# Patient Record
Sex: Male | Born: 1955 | Race: White | Hispanic: No | Marital: Married | Smoking: Never smoker
Health system: Southern US, Community
[De-identification: ages and names within clinical notes are randomized; demographics above are authoritative.]

## PROBLEM LIST (undated history)

## (undated) DIAGNOSIS — S83512A Sprain of anterior cruciate ligament of left knee, initial encounter: Secondary | ICD-10-CM

## (undated) DIAGNOSIS — K219 Gastro-esophageal reflux disease without esophagitis: Secondary | ICD-10-CM

## (undated) DIAGNOSIS — I10 Essential (primary) hypertension: Secondary | ICD-10-CM

## (undated) DIAGNOSIS — E785 Hyperlipidemia, unspecified: Secondary | ICD-10-CM

## (undated) DIAGNOSIS — Z8719 Personal history of other diseases of the digestive system: Secondary | ICD-10-CM

## (undated) HISTORY — PX: NASAL SINUS SURGERY: SHX719

## (undated) HISTORY — PX: HAIR TRANSPLANT: SHX1719

---

## 2015-06-14 ENCOUNTER — Other Ambulatory Visit: Payer: Self-pay | Admitting: Physician Assistant

## 2015-06-20 ENCOUNTER — Encounter (HOSPITAL_BASED_OUTPATIENT_CLINIC_OR_DEPARTMENT_OTHER): Payer: Self-pay | Admitting: *Deleted

## 2015-06-26 ENCOUNTER — Ambulatory Visit (HOSPITAL_BASED_OUTPATIENT_CLINIC_OR_DEPARTMENT_OTHER)
Admission: RE | Admit: 2015-06-26 | Payer: BLUE CROSS/BLUE SHIELD | Source: Ambulatory Visit | Admitting: Orthopedic Surgery

## 2015-06-26 HISTORY — DX: Gastro-esophageal reflux disease without esophagitis: K21.9

## 2015-06-26 HISTORY — DX: Hyperlipidemia, unspecified: E78.5

## 2015-06-26 HISTORY — DX: Sprain of anterior cruciate ligament of left knee, initial encounter: S83.512A

## 2015-06-26 HISTORY — DX: Personal history of other diseases of the digestive system: Z87.19

## 2015-06-26 HISTORY — DX: Essential (primary) hypertension: I10

## 2015-06-26 SURGERY — KNEE ARTHROSCOPY WITH ANTERIOR CRUCIATE LIGAMENT (ACL) REPAIR WITH HAMSTRING GRAFT
Anesthesia: General | Site: Knee | Laterality: Left

## 2015-11-14 DIAGNOSIS — R5383 Other fatigue: Secondary | ICD-10-CM | POA: Insufficient documentation

## 2015-11-14 DIAGNOSIS — G4710 Hypersomnia, unspecified: Secondary | ICD-10-CM | POA: Insufficient documentation

## 2016-03-11 DIAGNOSIS — G4733 Obstructive sleep apnea (adult) (pediatric): Secondary | ICD-10-CM | POA: Diagnosis not present

## 2016-03-21 DIAGNOSIS — G4733 Obstructive sleep apnea (adult) (pediatric): Secondary | ICD-10-CM | POA: Diagnosis not present

## 2016-04-11 DIAGNOSIS — G4733 Obstructive sleep apnea (adult) (pediatric): Secondary | ICD-10-CM | POA: Diagnosis not present

## 2016-05-09 DIAGNOSIS — Z0000 Encounter for general adult medical examination without abnormal findings: Secondary | ICD-10-CM | POA: Diagnosis not present

## 2016-05-09 DIAGNOSIS — I10 Essential (primary) hypertension: Secondary | ICD-10-CM | POA: Diagnosis not present

## 2016-05-09 DIAGNOSIS — E7800 Pure hypercholesterolemia, unspecified: Secondary | ICD-10-CM | POA: Diagnosis not present

## 2016-05-10 DIAGNOSIS — Z1211 Encounter for screening for malignant neoplasm of colon: Secondary | ICD-10-CM | POA: Diagnosis not present

## 2016-05-10 DIAGNOSIS — K219 Gastro-esophageal reflux disease without esophagitis: Secondary | ICD-10-CM | POA: Diagnosis not present

## 2016-05-10 DIAGNOSIS — Z79899 Other long term (current) drug therapy: Secondary | ICD-10-CM | POA: Diagnosis not present

## 2016-05-11 DIAGNOSIS — G4733 Obstructive sleep apnea (adult) (pediatric): Secondary | ICD-10-CM | POA: Diagnosis not present

## 2016-05-14 DIAGNOSIS — E7800 Pure hypercholesterolemia, unspecified: Secondary | ICD-10-CM | POA: Diagnosis not present

## 2016-05-14 DIAGNOSIS — I10 Essential (primary) hypertension: Secondary | ICD-10-CM | POA: Diagnosis not present

## 2016-05-16 DIAGNOSIS — H5213 Myopia, bilateral: Secondary | ICD-10-CM | POA: Diagnosis not present

## 2016-05-16 DIAGNOSIS — H33301 Unspecified retinal break, right eye: Secondary | ICD-10-CM | POA: Diagnosis not present

## 2016-05-16 DIAGNOSIS — H2513 Age-related nuclear cataract, bilateral: Secondary | ICD-10-CM | POA: Diagnosis not present

## 2016-05-20 ENCOUNTER — Inpatient Hospital Stay (HOSPITAL_COMMUNITY)
Admission: EM | Admit: 2016-05-20 | Discharge: 2016-05-22 | DRG: 247 | Disposition: A | Discharge status: Home / Self Care | Payer: BLUE CROSS/BLUE SHIELD | Attending: Cardiology | Admitting: Cardiology

## 2016-05-20 ENCOUNTER — Emergency Department (HOSPITAL_COMMUNITY): Payer: BLUE CROSS/BLUE SHIELD

## 2016-05-20 ENCOUNTER — Encounter (HOSPITAL_COMMUNITY): Payer: Self-pay | Admitting: Emergency Medicine

## 2016-05-20 ENCOUNTER — Encounter (HOSPITAL_COMMUNITY)
Admission: EM | Disposition: A | Discharge status: Home / Self Care | Payer: Self-pay | Source: Home / Self Care | Attending: Cardiology

## 2016-05-20 DIAGNOSIS — E785 Hyperlipidemia, unspecified: Secondary | ICD-10-CM | POA: Diagnosis not present

## 2016-05-20 DIAGNOSIS — K219 Gastro-esophageal reflux disease without esophagitis: Secondary | ICD-10-CM | POA: Diagnosis present

## 2016-05-20 DIAGNOSIS — G4733 Obstructive sleep apnea (adult) (pediatric): Secondary | ICD-10-CM | POA: Diagnosis not present

## 2016-05-20 DIAGNOSIS — I9589 Other hypotension: Secondary | ICD-10-CM | POA: Diagnosis not present

## 2016-05-20 DIAGNOSIS — Q231 Congenital insufficiency of aortic valve: Secondary | ICD-10-CM

## 2016-05-20 DIAGNOSIS — I77810 Thoracic aortic ectasia: Secondary | ICD-10-CM | POA: Diagnosis not present

## 2016-05-20 DIAGNOSIS — K449 Diaphragmatic hernia without obstruction or gangrene: Secondary | ICD-10-CM | POA: Diagnosis present

## 2016-05-20 DIAGNOSIS — Z955 Presence of coronary angioplasty implant and graft: Secondary | ICD-10-CM

## 2016-05-20 DIAGNOSIS — I2129 ST elevation (STEMI) myocardial infarction involving other sites: Secondary | ICD-10-CM | POA: Diagnosis not present

## 2016-05-20 DIAGNOSIS — R739 Hyperglycemia, unspecified: Secondary | ICD-10-CM | POA: Diagnosis not present

## 2016-05-20 DIAGNOSIS — R079 Chest pain, unspecified: Secondary | ICD-10-CM | POA: Diagnosis not present

## 2016-05-20 DIAGNOSIS — Z79899 Other long term (current) drug therapy: Secondary | ICD-10-CM

## 2016-05-20 DIAGNOSIS — I10 Essential (primary) hypertension: Secondary | ICD-10-CM | POA: Diagnosis present

## 2016-05-20 DIAGNOSIS — I2111 ST elevation (STEMI) myocardial infarction involving right coronary artery: Secondary | ICD-10-CM | POA: Diagnosis not present

## 2016-05-20 DIAGNOSIS — I213 ST elevation (STEMI) myocardial infarction of unspecified site: Secondary | ICD-10-CM | POA: Diagnosis present

## 2016-05-20 DIAGNOSIS — R0602 Shortness of breath: Secondary | ICD-10-CM | POA: Diagnosis not present

## 2016-05-20 DIAGNOSIS — I2119 ST elevation (STEMI) myocardial infarction involving other coronary artery of inferior wall: Secondary | ICD-10-CM | POA: Diagnosis present

## 2016-05-20 HISTORY — PX: CARDIAC CATHETERIZATION: SHX172

## 2016-05-20 LAB — I-STAT CHEM 8, ED
BUN: 23 mg/dL — ABNORMAL HIGH (ref 6–20)
Calcium, Ion: 1.15 mmol/L (ref 1.12–1.23)
Chloride: 98 mmol/L — ABNORMAL LOW (ref 101–111)
Creatinine, Ser: 1.3 mg/dL — ABNORMAL HIGH (ref 0.61–1.24)
GLUCOSE: 117 mg/dL — AB (ref 65–99)
HCT: 49 % (ref 39.0–52.0)
HEMOGLOBIN: 16.7 g/dL (ref 13.0–17.0)
POTASSIUM: 3.3 mmol/L — AB (ref 3.5–5.1)
Sodium: 139 mmol/L (ref 135–145)
TCO2: 27 mmol/L (ref 0–100)

## 2016-05-20 LAB — APTT: aPTT: 23 seconds — ABNORMAL LOW (ref 24–37)

## 2016-05-20 LAB — DIFFERENTIAL
Basophils Absolute: 0.1 10*3/uL (ref 0.0–0.1)
Basophils Relative: 1 %
Eosinophils Absolute: 0.1 10*3/uL (ref 0.0–0.7)
Eosinophils Relative: 1 %
LYMPHS ABS: 2.2 10*3/uL (ref 0.7–4.0)
LYMPHS PCT: 23 %
MONO ABS: 0.7 10*3/uL (ref 0.1–1.0)
MONOS PCT: 8 %
NEUTROS ABS: 6.6 10*3/uL (ref 1.7–7.7)
Neutrophils Relative %: 67 %

## 2016-05-20 LAB — CBC
HEMATOCRIT: 45.8 % (ref 39.0–52.0)
Hemoglobin: 16 g/dL (ref 13.0–17.0)
MCH: 30.8 pg (ref 26.0–34.0)
MCHC: 34.9 g/dL (ref 30.0–36.0)
MCV: 88.1 fL (ref 78.0–100.0)
Platelets: 206 10*3/uL (ref 150–400)
RBC: 5.2 MIL/uL (ref 4.22–5.81)
RDW: 12.6 % (ref 11.5–15.5)
WBC: 9.7 10*3/uL (ref 4.0–10.5)

## 2016-05-20 LAB — COMPREHENSIVE METABOLIC PANEL
ALBUMIN: 4.4 g/dL (ref 3.5–5.0)
ALK PHOS: 53 U/L (ref 38–126)
ALT: 34 U/L (ref 17–63)
ANION GAP: 13 (ref 5–15)
AST: 34 U/L (ref 15–41)
BUN: 20 mg/dL (ref 6–20)
CALCIUM: 9.9 mg/dL (ref 8.9–10.3)
CO2: 26 mmol/L (ref 22–32)
CREATININE: 1.37 mg/dL — AB (ref 0.61–1.24)
Chloride: 99 mmol/L — ABNORMAL LOW (ref 101–111)
GFR calc Af Amer: >60 mL/min (ref >60)
GFR calc non Af Amer: 56 mL/min — ABNORMAL LOW (ref >60)
GLUCOSE: 119 mg/dL — AB (ref 65–99)
Potassium: 3.3 mmol/L — ABNORMAL LOW (ref 3.5–5.1)
SODIUM: 138 mmol/L (ref 135–145)
Total Bilirubin: 0.8 mg/dL (ref 0.3–1.2)
Total Protein: 7.6 g/dL (ref 6.5–8.1)

## 2016-05-20 LAB — TROPONIN I: Troponin I: 0.19 ng/mL — ABNORMAL HIGH (ref <0.031)

## 2016-05-20 LAB — LIPID PANEL
CHOL/HDL RATIO: 3.3 ratio
Cholesterol: 199 mg/dL (ref 0–200)
HDL: 61 mg/dL (ref >40)
LDL CALC: 124 mg/dL — AB (ref 0–99)
TRIGLYCERIDES: 72 mg/dL (ref <150)
VLDL: 14 mg/dL (ref 0–40)

## 2016-05-20 LAB — PROTIME-INR
INR: 1.09 (ref 0.00–1.49)
Prothrombin Time: 14.3 seconds (ref 11.6–15.2)

## 2016-05-20 SURGERY — LEFT HEART CATH AND CORONARY ANGIOGRAPHY
Anesthesia: LOCAL

## 2016-05-20 MED ORDER — PANTOPRAZOLE SODIUM 40 MG PO TBEC
40.0000 mg | DELAYED_RELEASE_TABLET | Freq: Every day | ORAL | Status: DC
  Administered 2016-05-21 – 2016-05-22 (×2): 40 mg via ORAL
  Filled 2016-05-20 (×2): qty 1

## 2016-05-20 MED ORDER — NITROGLYCERIN 1 MG/10 ML FOR IR/CATH LAB
INTRA_ARTERIAL | Status: AC
  Filled 2016-05-20: qty 10

## 2016-05-20 MED ORDER — MIDAZOLAM HCL 2 MG/2ML IJ SOLN
INTRAMUSCULAR | Status: AC
  Filled 2016-05-20: qty 2

## 2016-05-20 MED ORDER — BIVALIRUDIN 250 MG IV SOLR
INTRAVENOUS | Status: AC
  Filled 2016-05-20: qty 250

## 2016-05-20 MED ORDER — OMEGA-3-ACID ETHYL ESTERS 1 G PO CAPS
2.0000 g | ORAL_CAPSULE | Freq: Two times a day (BID) | ORAL | Status: DC
  Administered 2016-05-21 – 2016-05-22 (×3): 2 g via ORAL
  Filled 2016-05-20 (×4): qty 2

## 2016-05-20 MED ORDER — FAMOTIDINE 20 MG PO TABS
20.0000 mg | ORAL_TABLET | Freq: Two times a day (BID) | ORAL | Status: DC
Start: 2016-05-20 — End: 2016-05-22
  Administered 2016-05-21 – 2016-05-22 (×4): 20 mg via ORAL
  Filled 2016-05-20 (×4): qty 1

## 2016-05-20 MED ORDER — HYDROMORPHONE HCL 1 MG/ML IJ SOLN
INTRAMUSCULAR | Status: AC
  Filled 2016-05-20: qty 1

## 2016-05-20 MED ORDER — HEPARIN SODIUM (PORCINE) 1000 UNIT/ML IJ SOLN
INTRAMUSCULAR | Status: DC | PRN
  Administered 2016-05-20: 3000 [IU] via INTRAVENOUS
  Administered 2016-05-20: 10000 [IU] via INTRAVENOUS
  Administered 2016-05-20: 2000 [IU] via INTRAVENOUS

## 2016-05-20 MED ORDER — TICAGRELOR 90 MG PO TABS
ORAL_TABLET | ORAL | Status: DC | PRN
  Administered 2016-05-20: 180 mg via ORAL

## 2016-05-20 MED ORDER — BIVALIRUDIN BOLUS VIA INFUSION - CUPID
INTRAVENOUS | Status: DC | PRN
  Administered 2016-05-20: 62.925 mg via INTRAVENOUS

## 2016-05-20 MED ORDER — SODIUM CHLORIDE 0.9 % IV SOLN
50000.0000 ug | INTRAVENOUS | Status: DC | PRN
  Administered 2016-05-20: 4 ug/kg/min via INTRAVENOUS

## 2016-05-20 MED ORDER — ADULT MULTIVITAMIN W/MINERALS CH
1.0000 | ORAL_TABLET | Freq: Every day | ORAL | Status: DC
  Administered 2016-05-21 – 2016-05-22 (×2): 1 via ORAL
  Filled 2016-05-20 (×2): qty 1

## 2016-05-20 MED ORDER — HEPARIN (PORCINE) IN NACL 2-0.9 UNIT/ML-% IJ SOLN
INTRAMUSCULAR | Status: AC
  Filled 2016-05-20: qty 1000

## 2016-05-20 MED ORDER — HYDROCHLOROTHIAZIDE 25 MG PO TABS
25.0000 mg | ORAL_TABLET | Freq: Every day | ORAL | Status: DC
  Administered 2016-05-21: 25 mg via ORAL
  Filled 2016-05-20: qty 1

## 2016-05-20 MED ORDER — CANGRELOR BOLUS VIA INFUSION
INTRAVENOUS | Status: DC | PRN
  Administered 2016-05-20: 2517 ug via INTRAVENOUS

## 2016-05-20 MED ORDER — ATORVASTATIN CALCIUM 80 MG PO TABS
80.0000 mg | ORAL_TABLET | Freq: Every day | ORAL | Status: DC
  Administered 2016-05-21: 80 mg via ORAL
  Filled 2016-05-20: qty 1

## 2016-05-20 MED ORDER — HEPARIN (PORCINE) IN NACL 2-0.9 UNIT/ML-% IJ SOLN
INTRAMUSCULAR | Status: DC | PRN
  Administered 2016-05-20: 23:00:00

## 2016-05-20 MED ORDER — SODIUM CHLORIDE 0.9 % IV SOLN
10.0000 mL/h | INTRAVENOUS | Status: DC
  Administered 2016-05-20: 20 mL/h via INTRAVENOUS

## 2016-05-20 MED ORDER — ASPIRIN 81 MG PO CHEW
324.0000 mg | CHEWABLE_TABLET | Freq: Once | ORAL | Status: AC
  Administered 2016-05-20: 324 mg via ORAL

## 2016-05-20 MED ORDER — MORPHINE SULFATE (PF) 2 MG/ML IV SOLN
2.0000 mg | Freq: Once | INTRAVENOUS | Status: AC
  Administered 2016-05-20: 2 mg via INTRAVENOUS
  Filled 2016-05-20: qty 1

## 2016-05-20 MED ORDER — HEPARIN SODIUM (PORCINE) 5000 UNIT/ML IJ SOLN
INTRAMUSCULAR | Status: AC
  Filled 2016-05-20: qty 1

## 2016-05-20 MED ORDER — CARVEDILOL 3.125 MG PO TABS
3.1250 mg | ORAL_TABLET | Freq: Two times a day (BID) | ORAL | Status: DC
  Administered 2016-05-21 – 2016-05-22 (×2): 3.125 mg via ORAL
  Filled 2016-05-20 (×2): qty 1

## 2016-05-20 MED ORDER — VITAMIN C 500 MG PO TABS
500.0000 mg | ORAL_TABLET | Freq: Every day | ORAL | Status: DC
  Administered 2016-05-21 – 2016-05-22 (×2): 500 mg via ORAL
  Filled 2016-05-20 (×2): qty 1

## 2016-05-20 MED ORDER — TICAGRELOR 90 MG PO TABS
90.0000 mg | ORAL_TABLET | Freq: Two times a day (BID) | ORAL | Status: DC
  Administered 2016-05-21 – 2016-05-22 (×3): 90 mg via ORAL
  Filled 2016-05-20 (×3): qty 1

## 2016-05-20 MED ORDER — IOPAMIDOL (ISOVUE-370) INJECTION 76%
INTRAVENOUS | Status: DC | PRN
  Administered 2016-05-20: 350 mL

## 2016-05-20 MED ORDER — NITROGLYCERIN 0.4 MG SL SUBL
0.4000 mg | SUBLINGUAL_TABLET | SUBLINGUAL | Status: DC | PRN
  Administered 2016-05-20 (×2): 0.4 mg via SUBLINGUAL

## 2016-05-20 MED ORDER — ASPIRIN 81 MG PO CHEW
CHEWABLE_TABLET | ORAL | Status: AC
  Administered 2016-05-20: 324 mg via ORAL
  Filled 2016-05-20: qty 4

## 2016-05-20 MED ORDER — SODIUM CHLORIDE 0.9 % IV SOLN
INTRAVENOUS | Status: AC
  Administered 2016-05-21 (×2): via INTRAVENOUS

## 2016-05-20 MED ORDER — VERAPAMIL HCL 2.5 MG/ML IV SOLN
INTRAVENOUS | Status: DC | PRN
  Administered 2016-05-20: 21:00:00 via INTRA_ARTERIAL

## 2016-05-20 MED ORDER — LOSARTAN POTASSIUM 25 MG PO TABS
25.0000 mg | ORAL_TABLET | Freq: Every day | ORAL | Status: DC
  Administered 2016-05-21 – 2016-05-22 (×2): 25 mg via ORAL
  Filled 2016-05-20 (×2): qty 1

## 2016-05-20 MED ORDER — ONDANSETRON HCL 4 MG/2ML IJ SOLN
INTRAMUSCULAR | Status: AC
  Filled 2016-05-20: qty 2

## 2016-05-20 MED ORDER — NITROGLYCERIN IN D5W 200-5 MCG/ML-% IV SOLN
0.0000 ug/min | Freq: Once | INTRAVENOUS | Status: AC
  Administered 2016-05-20: 5 ug/min via INTRAVENOUS
  Filled 2016-05-20: qty 250

## 2016-05-20 MED ORDER — TICAGRELOR 90 MG PO TABS
ORAL_TABLET | ORAL | Status: AC
  Filled 2016-05-20: qty 1

## 2016-05-20 MED ORDER — DIAZEPAM 5 MG PO TABS
5.0000 mg | ORAL_TABLET | Freq: Four times a day (QID) | ORAL | Status: DC | PRN
  Administered 2016-05-21: 5 mg via ORAL
  Filled 2016-05-20: qty 1

## 2016-05-20 MED ORDER — NITROGLYCERIN 0.4 MG SL SUBL
SUBLINGUAL_TABLET | SUBLINGUAL | Status: AC
  Administered 2016-05-20: 0.4 mg via SUBLINGUAL
  Filled 2016-05-20: qty 3

## 2016-05-20 MED ORDER — LIDOCAINE HCL (PF) 1 % IJ SOLN
INTRAMUSCULAR | Status: DC | PRN
  Administered 2016-05-20: 15 mL
  Administered 2016-05-20: 2 mL

## 2016-05-20 MED ORDER — FENTANYL CITRATE (PF) 100 MCG/2ML IJ SOLN
INTRAMUSCULAR | Status: AC
  Filled 2016-05-20: qty 2

## 2016-05-20 MED ORDER — SODIUM CHLORIDE 0.9% FLUSH: 3.0000 mL | INTRAVENOUS | Status: DC | PRN

## 2016-05-20 MED ORDER — ASPIRIN 81 MG PO CHEW
81.0000 mg | CHEWABLE_TABLET | Freq: Every day | ORAL | Status: DC
  Administered 2016-05-21 – 2016-05-22 (×2): 81 mg via ORAL
  Filled 2016-05-20 (×2): qty 1

## 2016-05-20 MED ORDER — HEPARIN SODIUM (PORCINE) 1000 UNIT/ML IJ SOLN
INTRAMUSCULAR | Status: AC
  Filled 2016-05-20: qty 1

## 2016-05-20 MED ORDER — ACETAMINOPHEN 325 MG PO TABS: 650.0000 mg | ORAL_TABLET | ORAL | Status: DC | PRN

## 2016-05-20 MED ORDER — GLUCOSAMINE-CHONDROITIN 500-400 MG PO TABS: 1.0000 | ORAL_TABLET | Freq: Three times a day (TID) | ORAL | Status: DC

## 2016-05-20 MED ORDER — MIDAZOLAM HCL 2 MG/2ML IJ SOLN
INTRAMUSCULAR | Status: DC | PRN
  Administered 2016-05-20: 1 mg via INTRAVENOUS
  Administered 2016-05-20 (×3): 2 mg via INTRAVENOUS

## 2016-05-20 MED ORDER — FENTANYL CITRATE (PF) 100 MCG/2ML IJ SOLN
INTRAMUSCULAR | Status: DC | PRN
  Administered 2016-05-20 (×2): 50 ug via INTRAVENOUS

## 2016-05-20 MED ORDER — MORPHINE SULFATE (PF) 2 MG/ML IV SOLN
INTRAVENOUS | Status: AC
  Administered 2016-05-20: 2 mg via INTRAVENOUS
  Filled 2016-05-20: qty 1

## 2016-05-20 MED ORDER — HEPARIN (PORCINE) IN NACL 100-0.45 UNIT/ML-% IJ SOLN
1100.0000 [IU]/h | INTRAMUSCULAR | Status: DC
  Administered 2016-05-20: 1100 [IU]/h via INTRAVENOUS
  Filled 2016-05-20: qty 250

## 2016-05-20 MED ORDER — LIDOCAINE HCL (PF) 1 % IJ SOLN
INTRAMUSCULAR | Status: AC
  Filled 2016-05-20: qty 30

## 2016-05-20 MED ORDER — SODIUM CHLORIDE 0.9 % IV SOLN
250.0000 mg | INTRAVENOUS | Status: DC | PRN
  Administered 2016-05-20: 1.75 mg/kg/h via INTRAVENOUS

## 2016-05-20 MED ORDER — ATROPINE SULFATE 1 MG/10ML IJ SOSY
PREFILLED_SYRINGE | INTRAMUSCULAR | Status: DC | PRN
  Administered 2016-05-20: 1 mg via INTRAVENOUS

## 2016-05-20 MED ORDER — ALPRAZOLAM 0.25 MG PO TABS: 0.2500 mg | ORAL_TABLET | Freq: Two times a day (BID) | ORAL | Status: DC | PRN

## 2016-05-20 MED ORDER — VERAPAMIL HCL 2.5 MG/ML IV SOLN
INTRAVENOUS | Status: AC
  Filled 2016-05-20: qty 2

## 2016-05-20 MED ORDER — HYDROMORPHONE HCL 1 MG/ML IJ SOLN
INTRAMUSCULAR | Status: DC | PRN
  Administered 2016-05-20 (×3): 0.5 mg via INTRAVENOUS

## 2016-05-20 MED ORDER — VITAMIN B-12 1000 MCG PO TABS
500.0000 ug | ORAL_TABLET | Freq: Every day | ORAL | Status: DC
Start: 2016-05-21 — End: 2016-05-22
  Administered 2016-05-22: 500 ug via ORAL
  Filled 2016-05-20 (×2): qty 1

## 2016-05-20 MED ORDER — HEPARIN SODIUM (PORCINE) 5000 UNIT/ML IJ SOLN
4000.0000 [IU] | INTRAMUSCULAR | Status: AC
  Administered 2016-05-20: 4000 [IU] via INTRAVENOUS

## 2016-05-20 MED ORDER — MORPHINE SULFATE (PF) 2 MG/ML IV SOLN
INTRAVENOUS | Status: AC
  Filled 2016-05-20: qty 1

## 2016-05-20 MED ORDER — SODIUM CHLORIDE 0.9% FLUSH
3.0000 mL | Freq: Two times a day (BID) | INTRAVENOUS | Status: DC
  Administered 2016-05-21 (×2): 3 mL via INTRAVENOUS

## 2016-05-20 MED ORDER — ATROPINE SULFATE 1 MG/10ML IJ SOSY
PREFILLED_SYRINGE | INTRAMUSCULAR | Status: AC
  Filled 2016-05-20: qty 10

## 2016-05-20 MED ORDER — NITROGLYCERIN 1 MG/10 ML FOR IR/CATH LAB
INTRA_ARTERIAL | Status: DC | PRN
  Administered 2016-05-20 (×2): 200 ug via INTRACORONARY

## 2016-05-20 MED ORDER — IOPAMIDOL (ISOVUE-370) INJECTION 76%
INTRAVENOUS | Status: AC
  Filled 2016-05-20: qty 100

## 2016-05-20 MED ORDER — SODIUM CHLORIDE 0.9 % IV SOLN: 250.0000 mL | INTRAVENOUS | Status: DC | PRN

## 2016-05-20 MED ORDER — CANGRELOR TETRASODIUM 50 MG IV SOLR
INTRAVENOUS | Status: AC
  Filled 2016-05-20: qty 50

## 2016-05-20 MED ORDER — ONDANSETRON HCL 4 MG/2ML IJ SOLN
4.0000 mg | Freq: Once | INTRAMUSCULAR | Status: AC
  Administered 2016-05-20: 4 mg via INTRAVENOUS
  Filled 2016-05-20: qty 2

## 2016-05-20 SURGICAL SUPPLY — 38 items
BALLN EMERGE MR 2.5X15 (BALLOONS) ×2
BALLN EUPHORA RX 2.0X10 (BALLOONS) ×2
BALLN ~~LOC~~ TREK RX 3.75X8 (BALLOONS) ×2
BALLOON EMERGE MR 2.5X15 (BALLOONS) ×1 IMPLANT
BALLOON EUPHORA RX 2.0X10 (BALLOONS) ×1 IMPLANT
BALLOON ~~LOC~~ TREK RX 3.75X8 (BALLOONS) ×1 IMPLANT
CATH EXTRAC PRONTO 5.5F 138CM (CATHETERS) ×2 IMPLANT
CATH HEARTRAIL 6F IR1.5 (CATHETERS) ×2 IMPLANT
CATH HEARTRAIL IKARI 6F IR1.0 (CATHETERS) ×2 IMPLANT
CATH INFINITI 5FR JL4 (CATHETERS) ×2 IMPLANT
CATH OPTITORQUE TIG 4.0 5F (CATHETERS) ×2 IMPLANT
CATH VISTA GUIDE 6FR JR4 (CATHETERS) ×2 IMPLANT
DEVICE CLOSURE PERCLS PRGLD 6F (VASCULAR PRODUCTS) ×1 IMPLANT
DEVICE RAD COMP TR BAND LRG (VASCULAR PRODUCTS) ×2 IMPLANT
DEVICE TORQUE .014-.018 (MISCELLANEOUS) ×1 IMPLANT
GLIDESHEATH INTRODUCER 6F 10CM (SHEATH) ×2 IMPLANT
GLIDESHEATH SLEND A-KIT 6F 20G (SHEATH) ×2 IMPLANT
GUIDE CATH RUNWAY 6FR AL 1 (CATHETERS) ×2 IMPLANT
GUIDE CATH RUNWAY 6FR AR1 (CATHETERS) ×2 IMPLANT
GUIDE CATH RUNWAY 6FR HS (CATHETERS) ×2 IMPLANT
GUIDELINER 6F (CATHETERS) ×2 IMPLANT
KIT ENCORE 26 ADVANTAGE (KITS) ×2 IMPLANT
KIT HEART LEFT (KITS) ×2 IMPLANT
PACK CARDIAC CATHETERIZATION (CUSTOM PROCEDURE TRAY) ×2 IMPLANT
PERCLOSE PROGLIDE 6F (VASCULAR PRODUCTS) ×2
SET INTRODUCER MICROPUNCT 5F (INTRODUCER) ×2 IMPLANT
SHEATH PINNACLE 6F 10CM (SHEATH) ×2 IMPLANT
STENT SYNERGY DES 3.5X12 (Permanent Stent) ×2 IMPLANT
STENT SYNERGY DES 3X20 (Permanent Stent) ×2 IMPLANT
STENT SYNERGY DES 3X28 (Permanent Stent) ×2 IMPLANT
TORQUE DEVICE .014-.018 (MISCELLANEOUS) ×2
TRANSDUCER W/STOPCOCK (MISCELLANEOUS) ×2 IMPLANT
TUBING CIL FLEX 10 FLL-RA (TUBING) ×2 IMPLANT
WIRE ASAHI PROWATER 180CM (WIRE) ×2 IMPLANT
WIRE ASAHI SOFT 180CM (WIRE) ×2 IMPLANT
WIRE COUGAR XT STRL 190CM (WIRE) ×2 IMPLANT
WIRE EMERALD 3MM-J .035X150CM (WIRE) ×2 IMPLANT
WIRE SAFE-T 1.5MM-J .035X260CM (WIRE) ×2 IMPLANT

## 2016-05-20 NOTE — ED Notes (Signed)
Dr. Diona FantiKilroy at bedside.

## 2016-05-20 NOTE — ED Notes (Signed)
Patient on Zoll monitor 

## 2016-05-20 NOTE — H&P (Signed)
Patient ID: Dan [name] MRN: [id number], DOB/AGE: 1956-01-12   Admit date: 05/20/2016   Primary Physician:  Duane Lopeoss, Alan, MD Primary Cardiologist: Dr Jannifer HickBerry-new  HPI: 60 y/o male with HTN and dyslipidemia admitted through the ED tonight with an inferior STEMI. Pt has no prior history of CAD or MI. He says he worked out today without problems. He noted some chest discomfort 3 hrs ago but it abated. 45 minutes ago he had recurrent chest pain and EMS was called. EKG shows inferior STEMI. He continues to have chest pain- 4/10' but there lab is currently occupied.    Problem List: Past Medical History  Diagnosis Date  . Hypertension   . GERD (gastroesophageal reflux disease)   . History of hiatal hernia   . Left ACL tear   . Hyperlipidemia     Past Surgical History  Procedure Laterality Date  . Nasal sinus surgery    . Hair transplant       Allergies: No Known Allergies   Home Medications Prior to Admission medications   Medication Sig Start Date End Date Taking? Authorizing Provider  atorvastatin (LIPITOR) 20 MG tablet Take 20 mg by mouth daily.    Historical Provider, MD  cyanocobalamin 500 MCG tablet Take 500 mcg by mouth daily.    Historical Provider, MD  dexlansoprazole (DEXILANT) 60 MG capsule Take 60 mg by mouth daily.    Historical Provider, MD  famotidine (PEPCID) 20 MG tablet Take 20 mg by mouth 2 (two) times daily.    Historical Provider, MD  glucosamine-chondroitin 500-400 MG tablet Take 1 tablet by mouth 3 (three) times daily.    Historical Provider, MD  hydrochlorothiazide (HYDRODIURIL) 25 MG tablet Take 25 mg by mouth daily. Takes 1/2 tab daily    Historical Provider, MD  losartan (COZAAR) 100 MG tablet Take 100 mg by mouth daily.    Historical Provider, MD  Multiple Vitamin (MULTIVITAMIN WITH MINERALS) TABS tablet Take 1 tablet by mouth daily.    Historical Provider, MD  Omega-3 Fatty Acids (FISH OIL) 1000 MG CAPS Take by mouth.    Historical Provider, MD  vitamin  C (ASCORBIC ACID) 500 MG tablet Take 500 mg by mouth daily.    Historical Provider, MD  Vitamins-Lipotropics (LIPOFLAVONOID) TABS Take by mouth.    Historical Provider, MD     FM Hx: no FM Hx of early CAD, just hyperlipidemia  Social History   Social History  . Marital Status: Married    Spouse Name: N/A  . Number of Children: N/A  . Years of Education: N/A   Occupational History  . Not on file.   Social History Main Topics  . Smoking status: Never Smoker   . Smokeless tobacco: Not on file  . Alcohol Use: Yes     Comment: social  . Drug Use: No  . Sexual Activity: Yes   Other Topics Concern  . Not on file   Social History Narrative     Review of Systems: General: negative for chills, fever, night sweats or weight changes.  Cardiovascular: negative for dyspnea on exertion, edema, orthopnea, palpitations, paroxysmal nocturnal dyspnea or shortness of breath HEENT: negative for any visual disturbances, blindness, glaucoma Dermatological: negative for rash Respiratory: negative for cough, hemoptysis, or wheezing Urologic: negative for hematuria or dysuria Abdominal: negative for nausea, vomiting, diarrhea, bright red blood per rectum, melena, or hematemesis Neurologic: negative for visual changes, syncope, or dizziness Musculoskeletal: negative for back pain, joint pain, or swelling Psych: cooperative and  appropriate All other systems reviewed and are otherwise negative except as noted above.  Physical Exam: Blood pressure 164/102, pulse 64, temperature 98.4 F (36.9 C), temperature source Oral, resp. rate 16, SpO2 100 %.  General appearance: alert, cooperative and moderate distress Neck: no carotid bruit and no JVD Lungs: clear to auscultation bilaterally Heart: regular rate and rhythm Abdomen: soft, non-tender; bowel sounds normal; no masses,  no organomegaly Extremities: extremities normal, atraumatic, no cyanosis or edema Pulses: 2+ and symmetric Skin: Skin  color, texture, turgor normal. No rashes or lesions Neurologic: Grossly normal    Labs:   Results for orders placed or performed during the hospital encounter of 05/20/16 (from the past 24 hour(s))  CBC     Status: None   Collection Time: 05/20/16  7:53 PM  Result Value Ref Range   WBC 9.7 4.0 - 10.5 K/uL   RBC 5.20 4.22 - 5.81 MIL/uL   Hemoglobin 16.0 13.0 - 17.0 g/dL   HCT 16.1 09.6 - 04.5 %   MCV 88.1 78.0 - 100.0 fL   MCH 30.8 26.0 - 34.0 pg   MCHC 34.9 30.0 - 36.0 g/dL   RDW 40.9 81.1 - 91.4 %   Platelets 206 150 - 400 K/uL  Differential     Status: None   Collection Time: 05/20/16  7:53 PM  Result Value Ref Range   Neutrophils Relative % 67 %   Neutro Abs 6.6 1.7 - 7.7 K/uL   Lymphocytes Relative 23 %   Lymphs Abs 2.2 0.7 - 4.0 K/uL   Monocytes Relative 8 %   Monocytes Absolute 0.7 0.1 - 1.0 K/uL   Eosinophils Relative 1 %   Eosinophils Absolute 0.1 0.0 - 0.7 K/uL   Basophils Relative 1 %   Basophils Absolute 0.1 0.0 - 0.1 K/uL     Radiology/Studies: No results found.  EKG:NSR inferior ST elevation with lateral ST depression  ASSESSMENT AND PLAN:  Principal Problem:   STEMI (ST elevation myocardial infarction) (HCC) Active Problems:   Essential hypertension   Dyslipidemia   PLAN:  Heparin, NTG, cath ASAP   Signed, Corine Shelter, PA-C 05/20/2016, 8:02 PM [phone]

## 2016-05-20 NOTE — ED Notes (Signed)
Pt to cath lab.

## 2016-05-20 NOTE — ED Notes (Signed)
Patient with chest pain that started 15 mins ago with shortness of breath and nausea.  Patient states that he was diaphoretic and pale in triage.

## 2016-05-20 NOTE — ED Provider Notes (Signed)
CSN: [id number]     Arrival date & time 05/20/16  1933 History   First MD Initiated Contact with Patient 05/20/16 1940     Chief Complaint  Patient presents with  . Code STEMI    Patient is a 60 y.o. male presenting with chest pain. The history is provided by the patient.  Chest Pain Pain location:  Substernal area Pain quality: pressure   Pain radiates to:  Does not radiate Pain radiates to the back: no   Pain severity:  Severe Onset quality:  Sudden Duration:  30 minutes Timing:  Constant Progression:  Improving Chronicity:  New Context: at rest   Relieved by:  None tried Worsened by:  Nothing tried Ineffective treatments:  None tried Associated symptoms: diaphoresis, nausea and shortness of breath   Risk factors: high cholesterol and hypertension     Past Medical History  Diagnosis Date  . Hypertension   . GERD (gastroesophageal reflux disease)   . History of hiatal hernia   . Left ACL tear   . Hyperlipidemia    Past Surgical History  Procedure Laterality Date  . Nasal sinus surgery    . Hair transplant     No family history on file. Social History  Substance Use Topics  . Smoking status: Never Smoker   . Smokeless tobacco: None  . Alcohol Use: Yes     Comment: social    Review of Systems  Constitutional: Positive for diaphoresis.  HENT: Negative for congestion.   Eyes: Negative for redness.  Respiratory: Positive for shortness of breath.   Cardiovascular: Positive for chest pain.  Gastrointestinal: Positive for nausea.  Genitourinary: Negative for flank pain.  Musculoskeletal: Negative for gait problem.  Skin: Negative for rash.  Neurological: Negative for speech difficulty.  Psychiatric/Behavioral: Negative for confusion.      Allergies  Review of patient's allergies indicates no known allergies.  Home Medications   Prior to Admission medications   Medication Sig Start Date End Date Taking? Authorizing Provider  atorvastatin (LIPITOR) 20 MG  tablet Take 20 mg by mouth daily.    Historical Provider, MD  cyanocobalamin 500 MCG tablet Take 500 mcg by mouth daily.    Historical Provider, MD  dexlansoprazole (DEXILANT) 60 MG capsule Take 60 mg by mouth daily.    Historical Provider, MD  famotidine (PEPCID) 20 MG tablet Take 20 mg by mouth 2 (two) times daily.    Historical Provider, MD  glucosamine-chondroitin 500-400 MG tablet Take 1 tablet by mouth 3 (three) times daily.    Historical Provider, MD  hydrochlorothiazide (HYDRODIURIL) 25 MG tablet Take 25 mg by mouth daily. Takes 1/2 tab daily    Historical Provider, MD  losartan (COZAAR) 100 MG tablet Take 100 mg by mouth daily.    Historical Provider, MD  Multiple Vitamin (MULTIVITAMIN WITH MINERALS) TABS tablet Take 1 tablet by mouth daily.    Historical Provider, MD  Omega-3 Fatty Acids (FISH OIL) 1000 MG CAPS Take by mouth.    Historical Provider, MD  vitamin C (ASCORBIC ACID) 500 MG tablet Take 500 mg by mouth daily.    Historical Provider, MD  Vitamins-Lipotropics (LIPOFLAVONOID) TABS Take by mouth.    Historical Provider, MD   BP 125/88 mmHg  Pulse 70  Temp(Src) 98.4 F (36.9 C) (Oral)  Resp 19  Ht 5' 10.08" (1.78 m)  Wt 83.9 kg  BMI 26.48 kg/m2  SpO2 100% Physical Exam  Constitutional: He appears well-developed and well-nourished. No distress.  HENT:  Head: Normocephalic  and atraumatic.  Right Ear: External ear normal.  Left Ear: External ear normal.  Nose: Nose normal.  Eyes: Lids are normal.  Neck: Normal range of motion and phonation normal.  Cardiovascular: Normal rate, regular rhythm and intact distal pulses.   Pulmonary/Chest: Effort normal and breath sounds normal.  Abdominal: Soft. He exhibits no distension. There is no tenderness.  Neurological: He is alert.  Skin: Skin is warm and dry. He is not diaphoretic. No pallor.  Psychiatric: He has a normal mood and affect.    ED Course  Procedures (including critical care time) Labs Review Labs Reviewed   APTT - Abnormal; Notable for the following:    aPTT 23 (*)    All other components within normal limits  COMPREHENSIVE METABOLIC PANEL - Abnormal; Notable for the following:    Potassium 3.3 (*)    Chloride 99 (*)    Glucose, Bld 119 (*)    Creatinine, Ser 1.37 (*)    GFR calc non Af Amer 56 (*)    All other components within normal limits  TROPONIN I - Abnormal; Notable for the following:    Troponin I 0.19 (*)    All other components within normal limits  LIPID PANEL - Abnormal; Notable for the following:    LDL Cholesterol 124 (*)    All other components within normal limits  I-STAT CHEM 8, ED - Abnormal; Notable for the following:    Potassium 3.3 (*)    Chloride 98 (*)    BUN 23 (*)    Creatinine, Ser 1.30 (*)    Glucose, Bld 117 (*)    All other components within normal limits  CBC  DIFFERENTIAL  PROTIME-INR    Imaging Review Dg Chest Port 1 View  05/20/2016  CLINICAL DATA:  Acute onset of chest pain and shortness of breath with nausea. EXAM: PORTABLE CHEST 1 VIEW COMPARISON:  None. FINDINGS: The cardiac silhouette, mediastinal and hilar contours are within normal limits. The lungs are clear of acute process. Minimal left basilar scarring changes or subsegmental atelectasis. The bony thorax is intact. IMPRESSION: Minimal streaky left basilar scarring or atelectasis but no infiltrates, edema or effusions. Electronically Signed   By: Rudie MeyerP.  Gallerani M.D.   On: 05/20/2016 20:09   I have personally reviewed and evaluated these images and lab results as part of my medical decision-making.  EKG Interpretation Inferior STEMI Rate 58, Sinus brady, PR 140, QTc 394, Normal axis      MDM   Final diagnoses:  STEMI   60 y.o. male presents to the ED due to chest pressure. Triage EKG showed an inferior STEMI. Code stemi called. Gave aspirin and heparin bolus. Cardiology evaluated the patient at the bedside and took him to the cath lab.   His pain improved to 3/10 and he declined  wanting any nitroglycerin.   Taken to the cath lab. Labs pending. CXR unremarkable.   Case managed in conjunction with my attending, Dr. Rhunette CroftNanavati.     Maxine GlennAnn Jenie Parish, MD 05/20/16 [id number]  Derwood KaplanAnkit Nanavati, MD 05/22/16 1840

## 2016-05-21 ENCOUNTER — Encounter (HOSPITAL_COMMUNITY): Payer: Self-pay | Admitting: Cardiology

## 2016-05-21 ENCOUNTER — Inpatient Hospital Stay (HOSPITAL_COMMUNITY): Payer: BLUE CROSS/BLUE SHIELD

## 2016-05-21 DIAGNOSIS — E785 Hyperlipidemia, unspecified: Secondary | ICD-10-CM

## 2016-05-21 DIAGNOSIS — I10 Essential (primary) hypertension: Secondary | ICD-10-CM

## 2016-05-21 DIAGNOSIS — I2111 ST elevation (STEMI) myocardial infarction involving right coronary artery: Secondary | ICD-10-CM

## 2016-05-21 LAB — CBC
HCT: 36.8 % — ABNORMAL LOW (ref 39.0–52.0)
Hemoglobin: 12.4 g/dL — ABNORMAL LOW (ref 13.0–17.0)
MCH: 30.5 pg (ref 26.0–34.0)
MCHC: 33.7 g/dL (ref 30.0–36.0)
MCV: 90.4 fL (ref 78.0–100.0)
PLATELETS: 172 10*3/uL (ref 150–400)
RBC: 4.07 MIL/uL — ABNORMAL LOW (ref 4.22–5.81)
RDW: 13 % (ref 11.5–15.5)
WBC: 9 10*3/uL (ref 4.0–10.5)

## 2016-05-21 LAB — LIPID PANEL
CHOL/HDL RATIO: 2.9 ratio
CHOLESTEROL: 144 mg/dL (ref 0–200)
HDL: 50 mg/dL (ref >40)
LDL Cholesterol: 91 mg/dL (ref 0–99)
TRIGLYCERIDES: 13 mg/dL (ref <150)
VLDL: 3 mg/dL (ref 0–40)

## 2016-05-21 LAB — MRSA PCR SCREENING: MRSA BY PCR: NEGATIVE

## 2016-05-21 LAB — BASIC METABOLIC PANEL
ANION GAP: 9 (ref 5–15)
Anion gap: 8 (ref 5–15)
BUN: 15 mg/dL (ref 6–20)
BUN: 17 mg/dL (ref 6–20)
CALCIUM: 8.2 mg/dL — AB (ref 8.9–10.3)
CO2: 23 mmol/L (ref 22–32)
CO2: 25 mmol/L (ref 22–32)
CREATININE: 1.25 mg/dL — AB (ref 0.61–1.24)
Calcium: 8.2 mg/dL — ABNORMAL LOW (ref 8.9–10.3)
Chloride: 105 mmol/L (ref 101–111)
Chloride: 106 mmol/L (ref 101–111)
Creatinine, Ser: 1.11 mg/dL (ref 0.61–1.24)
GFR calc non Af Amer: >60 mL/min (ref >60)
GLUCOSE: 103 mg/dL — AB (ref 65–99)
GLUCOSE: 106 mg/dL — AB (ref 65–99)
POTASSIUM: 4 mmol/L (ref 3.5–5.1)
Potassium: 4.2 mmol/L (ref 3.5–5.1)
Sodium: 137 mmol/L (ref 135–145)
Sodium: 139 mmol/L (ref 135–145)

## 2016-05-21 LAB — TROPONIN I
Troponin I: >65 ng/mL (ref <0.031)
Troponin I: >65 ng/mL (ref <0.031)
Troponin I: >65 ng/mL (ref <0.031)

## 2016-05-21 LAB — POCT ACTIVATED CLOTTING TIME
ACTIVATED CLOTTING TIME: 692 s
Activated Clotting Time: 257 seconds
Activated Clotting Time: 279 seconds

## 2016-05-21 LAB — MAGNESIUM: Magnesium: 1.7 mg/dL (ref 1.7–2.4)

## 2016-05-21 MED ORDER — POTASSIUM CHLORIDE CRYS ER 20 MEQ PO TBCR
40.0000 meq | EXTENDED_RELEASE_TABLET | Freq: Once | ORAL | Status: AC
  Administered 2016-05-21: 40 meq via ORAL
  Filled 2016-05-21: qty 2

## 2016-05-21 NOTE — Progress Notes (Signed)
Patient ID: Dan [name], male   DOB: 01-04-1956, 60 y.o.   MRN: [id number] Subjective:  No further chest pain. Concerned about future coronary risk, states that he exercises regularly and eats healthy.  Objective:  Vital Signs in the last 24 hours: Temp:  [98 F (36.7 C)-98.4 F (36.9 C)] 98 F (36.7 C) (06/06 0400) Pulse Rate:  [0-120] 59 (06/06 0600) Resp:  [0-23] 22 (06/06 0600) BP: (84-164)/(61-111) 110/72 mmHg (06/06 0600) SpO2:  [0 %-100 %] 100 % (06/06 0600) Weight:  [83.7 kg (184 lb 8.4 oz)-83.9 kg (184 lb 15.5 oz)] 83.7 kg (184 lb 8.4 oz) (06/05 2330)  Intake/Output from previous day: 06/05 0701 - 06/06 0700 In: 1025.9 [I.V.:1025.9] Out: 525 [Urine:525]  Physical Exam:   General appearance: alert, cooperative, appears stated age and no distress Eyes: negative findings: lids and lashes normal Neck: no adenopathy, no carotid bruit, no JVD, supple, symmetrical, trachea midline and thyroid not enlarged, symmetric, no tenderness/mass/nodules Neck: JVP - normal, carotids 2+= without bruits Resp: clear to auscultation bilaterally Chest wall: no tenderness Cardio: regular rate and rhythm, S1, S2 normal, no murmur, click, rub or gallop GI: soft, non-tender; bowel sounds normal; no masses,  no organomegaly Extremities: extremities normal, atraumatic, no cyanosis or edema Right radial arterial access site is healed well. Right femoral arterial access site has healed well. No hematoma.   Lab Results: BMP  Recent Labs  05/20/16 1953 05/20/16 2007 05/21/16 0042 05/21/16 0528  NA 138 139 137 139  K 3.3* 3.3* 4.0 4.2  CL 99* 98* 105 106  CO2 26  --  23 25  GLUCOSE 119* 117* 103* 106*  BUN 20 23* 17 15  CREATININE 1.37* 1.30* 1.11 1.25*  CALCIUM 9.9  --  8.2* 8.2*  GFRNONAA 56*  --  >60 >60  GFRAA >60  --  >60 >60    CBC  Recent Labs Lab 05/20/16 1953  05/21/16 0528  WBC 9.7  --  9.0  RBC 5.20  --  4.07*  HGB 16.0  < > 12.4*  HCT 45.8  < > 36.8*  PLT 206  --   172  MCV 88.1  --  90.4  MCH 30.8  --  30.5  MCHC 34.9  --  33.7  RDW 12.6  --  13.0  LYMPHSABS 2.2  --   --   MONOABS 0.7  --   --   EOSABS 0.1  --   --   BASOSABS 0.1  --   --   < > = values in this interval not displayed. Cardiac Panel (last 3 results)  Recent Labs  05/20/16 1953 05/21/16 0049 05/21/16 0528  TROPONINI 0.19* >65.00* >65.00*    Recent Labs  05/20/16 1953  PROT 7.6  ALBUMIN 4.4  AST 34  ALT 34  ALKPHOS 53  BILITOT 0.8    Imaging: Imaging results have been reviewed  Cardiac Studies:  EKG: 05/21/2016: Idioventricular rhythm this morning.  Repeat EKG: Sinus rhythm, marked sinus bradycardia, inferior infarct old, anterior infarct old. Nonspecific T abnormality.  Assessment/Plan:  1. Acute inferior and lateral STEMI status post successful but very complex and difficult PTCA and stenting to the mid RCA and proximal RCA with implantation of 2 overlapping 3.0 x 20, 3.0 x 28 mm Synergy DES in the distal end, 3.5 x 12 mm Synergy DES in the proximal to mid segment of the RCA. 2. Hypertension 3. Hyperlipidemia 4. Hyperglycemia  Recommendation: Patient presently doing well and remains asymptomatic without recurrence  of chest pain. We'll transfer the patient to telemetry, have cardiac rehabilitation see the patient. Potentially discharge home in the morning. Continue dual antiplatelet therapy and high-dose statin therapy for now.   Yates DecampGANJI, Mike Berntsen, M.D. 05/21/2016, 11:51 AM [facility] Pager: [phone] Office: [phone] If no answer: [phone] 025 1984

## 2016-05-21 NOTE — Progress Notes (Signed)
  Echocardiogram 2D Echocardiogram has been performed.  Dan [name], Dan [name] 05/21/2016, 12:30 PM

## 2016-05-21 NOTE — Care Management Note (Signed)
Case Management Note  Patient Details  Name: Dan [name] MRN: [id number] Date of Birth: 06/05/1956  Subjective/Objective:      Adm w mi              Action/Plan: lives w wife, pcp dr Tenny Crawross   Expected Discharge Date:                  Expected Discharge Plan:  Home/Self Care  In-House Referral:     Discharge planning Services  CM Consult, Medication Assistance  Post Acute Care Choice:    Choice offered to:     DME Arranged:    DME Agency:     HH Arranged:    HH Agency:     Status of Service:     Medicare Important Message Given:    Date Medicare IM Given:    Medicare IM give by:    Date Additional Medicare IM Given:    Additional Medicare Important Message give by:     If discussed at Long Length of Stay Meetings, dates discussed:    Additional Comments: gave pt 30day free and copay card for brilinta.  Hanley Haysowell, Helder Crisafulli T, RN 05/21/2016, 10:07 AM

## 2016-05-21 NOTE — Progress Notes (Addendum)
Cardiologist:  Otto HerbNew, Dan [name] Subjective:  Feeling better this morning. With his excellent cholesterol, he was confused why he had a heart attack, exercising. Discussed with nursing. No shortness of breath, no significant chest pain.  Objective:  Vital Signs in the last 24 hours: Temp:  [98 F (36.7 C)-98.4 F (36.9 C)] 98 F (36.7 C) (06/06 0400) Pulse Rate:  [0-120] 59 (06/06 0600) Resp:  [0-23] 22 (06/06 0600) BP: (84-164)/(61-111) 110/72 mmHg (06/06 0600) SpO2:  [0 %-100 %] 100 % (06/06 0600) Weight:  [184 lb 8.4 oz (83.7 kg)-184 lb 15.5 oz (83.9 kg)] 184 lb 8.4 oz (83.7 kg) (06/05 2330)  Intake/Output from previous day: 06/05 0701 - 06/06 0700 In: 1025.9 [I.V.:1025.9] Out: 525 [Urine:525]   Physical Exam: General: Well developed, well nourished, in no acute distress. Head:  Normocephalic and atraumatic. Lungs: Clear to auscultation and percussion. Heart: Normal S1 and S2.  No murmur, rubs or gallops.  Abdomen: soft, non-tender, positive bowel sounds. Extremities: No clubbing or cyanosis. No edema. Both radial and femoral access site. Neurologic: Alert and oriented x 3.    Lab Results:  Recent Labs  05/20/16 1953 05/20/16 2007 05/21/16 0528  WBC 9.7  --  9.0  HGB 16.0 16.7 12.4*  PLT 206  --  172    Recent Labs  05/21/16 0042 05/21/16 0528  NA 137 139  K 4.0 4.2  CL 105 106  CO2 23 25  GLUCOSE 103* 106*  BUN 17 15  CREATININE 1.11 1.25*    Recent Labs  05/21/16 0049 05/21/16 0528  TROPONINI >65.00* >65.00*   Hepatic Function Panel  Recent Labs  05/20/16 1953  PROT 7.6  ALBUMIN 4.4  AST 34  ALT 34  ALKPHOS 53  BILITOT 0.8    Recent Labs  05/21/16 0049  CHOL 144   No results for input(s): PROTIME in the last 72 hours.  Imaging: Dg Chest Port 1 View  05/20/2016  CLINICAL DATA:  Acute onset of chest pain and shortness of breath with nausea. EXAM: PORTABLE CHEST 1 VIEW COMPARISON:  None. FINDINGS: The cardiac silhouette, mediastinal  and hilar contours are within normal limits. The lungs are clear of acute process. Minimal left basilar scarring changes or subsegmental atelectasis. The bony thorax is intact. IMPRESSION: Minimal streaky left basilar scarring or atelectasis but no infiltrates, edema or effusions. Electronically Signed   By: Rudie MeyerP.  Gallerani M.D.   On: 05/20/2016 20:09   Personally viewed.   Telemetry: Reperfusion rhythm, accelerated idioventricular rhythm. Personally viewed.   EKG: Sinus bradycardia inferior lateral minimal ST elevation on 05/21/69 Personally viewed.  Cardiac Studies:  Cardiac catheterization 05/20/16  1. Acute ST segment elevation myocardial infarction involving inferior and lateral wall. 2. Large very dominant right coronary artery with large PL and PDA branches supplying large area of inferior and inferolateral wall. One or percent occluded in the mid to distal segment. Successful PTCA and stenting of the distal and mid RCA with implantation of 2 overlapping 3.0 x 20 and 3.0 x 28 mm Synergy DES. Stenting of the proximal to mid segment of the right coronary artery with a 3.5 x 12 mm Synergy DES 100% occlusion in the distal end and 80% occlusion in the proximal end. Stenosis reduced to 0%. TIMI 0 flow improved to TIMI-3 flow.  3. Mild disease in the left coronary system. Tortuosity was evident. Mild coronary calcification evident.  Recommendation: Patient will need aggressive risk factor modification. He received 350 mL contrast and 60 minutes of  fluoroscopy time. We will watch him for radiation injury as we follow along in the outpatient basis. Echocardiogram in the morning to evaluate LV function. LV gram not performed.  Meds: Scheduled Meds: . aspirin  81 mg Oral Daily  . atorvastatin  80 mg Oral q1800  . carvedilol  3.125 mg Oral BID WC  . cyanocobalamin  500 mcg Oral Daily  . famotidine  20 mg Oral BID  . hydrochlorothiazide  25 mg Oral Daily  . losartan  25 mg Oral Daily  . multivitamin  with minerals  1 tablet Oral Daily  . omega-3 acid ethyl esters  2 g Oral BID  . pantoprazole  40 mg Oral Daily  . sodium chloride flush  3 mL Intravenous Q12H  . ticagrelor  90 mg Oral BID  . vitamin C  500 mg Oral Daily   Continuous Infusions: . sodium chloride 20 mL/hr (05/20/16 1949)   PRN Meds:.sodium chloride, acetaminophen, ALPRAZolam, diazepam, nitroGLYCERIN, sodium chloride flush  Assessment/Plan:  Principal Problem:   STEMI (ST elevation myocardial infarction) (HCC) Active Problems:   Dyslipidemia   Essential hypertension   Acute MI, inferolateral wall, initial episode of care (HCC)  Inferolateral ST elevation myocardial infarction  - Significant contrast use 350 mL as well as radiation exposure. We will continue to monitor as outpatient for signs of injury.  - Brilinta, aspirin  - Troponin greater than 65.  - Checking echocardiogram.  Essential hypertension  - HCTZ as well as BB. Monitor.   Hyperlipidemia  - great LDL. Continue high dose statin  Cardiac rehab  At the end of the interview, he stated that Dr. Jacinto Halim will be seeing him in close follow up and saw him earlier today (I did not see note yet). He was on my list to see, but from this point forward, Dr. Jacinto Halim will continue to follow. Called to discuss with him. OK with him.   Dan [name] 05/21/2016, 9:28 AM

## 2016-05-21 NOTE — Progress Notes (Signed)
Admitted pt from 2H per wheelchair assisted by 2 RNs. Alert oriented x4 , denies chest pain, not in respiratory distress. Called CCMT  on telemetry box 10. Oriented to room and call bell. Will continue to monitor pt.   05/21/16 2315  Vitals  Temp 99.9 F (37.7 C)  Temp Source Oral  BP 109/67 mmHg  BP Location Right Arm  BP Method Automatic  Patient Position (if appropriate) Lying  ECG Heart Rate 69  Resp 18  Oxygen Therapy  SpO2 96 %  O2 Device Room Air  Pain Assessment  Pain Assessment No/denies pain  Height and Weight  Height 5\' 10"  (1.778 m)  Weight 84.5 kg (186 lb 4.6 oz)  Type of Scale Used Standing  Type of Weight Actual  BSA (Calculated - sq m) 2.04 sq meters  BMI (Calculated) 26.8  Weight in (lb) to have BMI = 25 173.9

## 2016-05-21 NOTE — Progress Notes (Signed)
CARDIAC REHAB PHASE I   PRE:  Rate/Rhythm: 56 SB PVCs  BP:  Supine: 94/74  Sitting:   Standing:    SaO2: 98% 1L  MODE:  Ambulation: 520 ft   POST:  Rate/Rhythm: 77 SR PVCs  BP:  Supine:   Sitting: 110/77  Standing:    SaO2: 92-93%RA 1020-1134 Pt walked 520 ft on RA with steady gait. No CP. Tired by end of walk. To bed. MI education completed with pt and wife who voiced understanding. Stressed importance of brilinta with stent. Has brilinta card. Reviewed NTG use, MI restrictions, ex ed, risk factors, and diet. Pt has been on statin and has been eating a heart healthy diet. Discussed CRP 2 and will refer to [place].   Luetta Nuttingharlene Jase Reep, RN BSN  05/21/2016 11:30 AM

## 2016-05-22 LAB — ECHOCARDIOGRAM COMPLETE
HEIGHTINCHES: 70 in
WEIGHTICAEL: 2952.4 [oz_av]

## 2016-05-22 MED ORDER — ATORVASTATIN CALCIUM 80 MG PO TABS: 80.0000 mg | ORAL_TABLET | Freq: Every day | ORAL | Status: AC

## 2016-05-22 MED ORDER — TICAGRELOR 90 MG PO TABS: 90.0000 mg | ORAL_TABLET | Freq: Two times a day (BID) | ORAL | Status: DC

## 2016-05-22 MED ORDER — CARVEDILOL 3.125 MG PO TABS: 3.1250 mg | ORAL_TABLET | Freq: Two times a day (BID) | ORAL | Status: DC

## 2016-05-22 MED ORDER — LOSARTAN POTASSIUM 25 MG PO TABS: 25.0000 mg | ORAL_TABLET | Freq: Every day | ORAL | Status: DC

## 2016-05-22 MED ORDER — NITROGLYCERIN 0.4 MG SL SUBL: 0.4000 mg | SUBLINGUAL_TABLET | SUBLINGUAL | Status: DC | PRN

## 2016-05-22 MED ORDER — ASPIRIN 81 MG PO CHEW: 81.0000 mg | CHEWABLE_TABLET | Freq: Every day | ORAL | Status: AC

## 2016-05-22 MED FILL — NITROGLYCERIN 0.4 MG TAB SL: 0.4 | 7 days supply | Qty: 25 | Fill #0

## 2016-05-22 MED FILL — ATORVASTATIN 80 MG TABLET: 80 | 30 days supply | Qty: 30 | Fill #0

## 2016-05-22 MED FILL — LOSARTAN POTASSIUM 25 MG TA: 25 | 30 days supply | Qty: 30 | Fill #0

## 2016-05-22 MED FILL — BRILINTA 90 MG TABLET: 90 | 30 days supply | Qty: 60 | Fill #0

## 2016-05-22 MED FILL — CARVEDILOL 3.125 MG TABLET: 3.125 | 30 days supply | Qty: 60 | Fill #0

## 2016-05-22 NOTE — Discharge Instructions (Signed)
Your procedure required the use of prolonged amounts of x-ray.  Radiation side-effects are unlikely but possible.  Please have a family member inspect your chest and back area daily, for signs of redness or rash two weeks from today.  Please call your provider and tell us whether if you have concerns about your findings.  Acute Coronary Syndrome Acute coronary syndrome (ACS) is a serious problem in which there is suddenly not enough blood and oxygen supplied to the heart. ACS may mean that one or more of the blood vessels in your heart (coronary arteries) may be blocked. ACS can result in chest pain or a heart attack (myocardial infarction or MI). CAUSES This condition is caused by atherosclerosis, which is the buildup of fat and cholesterol (plaque) on the inside of the arteries. Over time, the plaque may narrow or block the artery, and this will lessen blood flow to the heart. Plaque can also become weak and break off within a coronary artery to form a clot and cause a sudden blockage. RISK FACTORS The risks factors of this condition include:  High cholesterol levels.  High blood pressure (hypertension).  Smoking.  Diabetes.  Age.  Family history of chest pain, heart disease, or stroke.  Lack of exercise. SYMPTOMS The most common signs of this condition include:  Chest pain, which can be:  A crushing or squeezing in the chest.  A tightness, pressure, fullness, or heaviness in the chest.  Present for more than a few minutes, or it can stop and recur.  Pain in the arms, neck, jaw, or back.  Unexplained heartburn or indigestion.  Shortness of breath.  Nausea.  Sudden cold sweats.  Feeling light-headed or dizzy. Sometimes, this condition has no symptoms. DIAGNOSIS ACS may be diagnosed through the following tests:  Electrocardiogram (ECG).  Blood tests.  Coronary angiogram. This is a procedure to look at the coronary arteries to see if there is any  blockage. TREATMENT Treatment for ACS may include:  Healthy behavioral changes to reduce or control risk factors.  Medicine.  Coronary stenting.A stent helps to keep an artery open.  Coronary angioplasty. This procedure widens a narrowed or blocked artery.  Coronary artery bypass surgery. This will allow your blood to pass the blockage (bypass) to reach your heart. HOME CARE INSTRUCTIONS Eating and Drinking  Follow a heart-healthy diet. A dietitian can you help to educate you about healthy food options and changes.  Use healthy cooking methods such as roasting, grilling, broiling, baking, poaching, steaming, or stir-frying. Talk to a dietitian to learn more about healthy cooking methods. Medicines  Take medicines only as directed by your health care provider.  Do not take the following medicines unless your health care provider approves:  Nonsteroidal anti-inflammatory drugs (NSAIDs), such as ibuprofen, naproxen, or celecoxib.  Vitamin supplements that contain vitamin A, vitamin E, or both.  Hormone replacement therapy that contains estrogen with or without progestin.  Stop illegal drug use. Activities  Follow an exercise program that is approved by your health care provider.  Plan rest periods when you are fatigued. Lifestyle  Do not use any tobacco products, including cigarettes, chewing tobacco, or electronic cigarettes. If you need help quitting, ask your health care provider.  If you drink alcohol, and your health care provider approves, limit your alcohol intake to no more than 1 drink per day. One drink equals 12 ounces of beer, 5 ounces of wine, or 1 ounces of hard liquor.  Learn to manage stress.  Maintain a  healthy weight. Lose weight as approved by your health care provider. General Instructions  Manage other health conditions, such as hypertension and diabetes, as directed by your health care provider.  Keep all follow-up visits as directed by your  health care provider. This is important.  Your health care provider may ask you to monitor your blood pressure. A blood pressure reading consists of a higher number over a lower number, such as 110 over 72, written as 110/72. Ideally, your blood pressure should be:  Below 140/90 if you have no other medical conditions.  Below 130/80 if you have diabetes or kidney disease. SEEK IMMEDIATE MEDICAL CARE IF:  You have pain in your chest, neck, arm, jaw, stomach, or back that lasts more than a few minutes, is recurring, or is not relieved by taking medicine under your tongue (sublingual nitroglycerin).  You have profuse sweating without cause.  You have unexplained:  Heartburn or indigestion.  Shortness of breath or difficulty breathing.  Nausea or vomiting.  Fatigue.  Feelings of nervousness or anxiety.  Weakness.  Diarrhea.  You have sudden light-headedness or dizziness.  You faint. These symptoms may represent a serious problem that is an emergency. Do not wait to see if the symptoms will go away. Get medical help right away. Call your local emergency services (911 in the [place] Do not drive yourself to the clinic or hospital.   This information is not intended to replace advice given to you by your health care provider. Make sure you discuss any questions you have with your health care provider.   Document Released: 12/02/2005 Document Revised: 12/23/2014 Document Reviewed: 04/05/2014 Elsevier Interactive Patient Education [employer]2016 [employer] Inc[employer]

## 2016-05-22 NOTE — Progress Notes (Signed)
Pt discharged home, IV and tele removed. Discharge instructions given wife at the bedside. Questions answered.

## 2016-05-22 NOTE — Discharge Summary (Signed)
Physician Discharge Summary  Patient ID: Dan [name] MRN: [id number] DOB/AGE: 12/18/1955 60 y.o.  Admit date: 05/20/2016 Discharge date: 05/22/2016  Discharge Diagnoses: 1. Acute inferior and lateral STEMI status post successful but very complex and difficult PTCA and stenting to the mid RCA and proximal RCA with implantation of 2 overlapping 3.0 x 20, 3.0 x 28 mm Synergy DES in the distal end, 3.5 x 12 mm Synergy DES in the proximal to mid segment of the RCA. 2. Hypertension 3. Hyperlipidemia 4. Hyperglycemia 5. Obstructive sleep apnea on CPAP. 6. Aortic root dilatation at 4.5 cm and possible bicuspid aortic valve.  Significant Diagnostic Studies: Coronary Angiogram 05/20/2016: 1. Acute ST segment elevation myocardial infarction involving inferior and lateral wall. 2. Large very dominant right coronary artery with large PL and PDA branches supplying large area of inferior and inferolateral wall. One or percent occluded in the mid to distal segment. Successful PTCA and stenting of the distal and mid RCA with implantation of 2 overlapping 3.0 x 20 and 3.0 x 28 mm Synergy DES. Stenting of the proximal to mid segment of the right coronary artery with a 3.5 x 12 mm Synergy DES 100% occlusion in the distal end and 80% occlusion in the proximal end. Stenosis reduced to 0%. TIMI 0 flow improved to TIMI-3 flow.  3. Mild disease in the left coronary system. Tortuosity was evident. Mild coronary calcification evident.  Echo 05/21/2016: - Left ventricle: The cavity size was normal. Systolic function was normal. The estimated ejection fraction was in the range of 50% to 55%. Poor apical views. Mild Hypokinesis of the inferolateral myocardium. Left ventricular diastolic function parameters were normal. - Aortic valve: Noncalcified annulus. Possibly bicuspid; normal thickness, mildly calcified leaflets; fusion of the right-left coronary commissure. There was mild regurgitation. - Ascending aorta: The ascending  aorta was mildly dilated at 4.4cm. - Left atrium: The atrium was mildly dilated. - Atrial septum: No defect or patent foramen ovale was identified. - Inferior vena cava: The vessel was normal in size. The respirophasic diameter changes were mildly reduced consistent with elevated central venous pressure.  Hospital Course:  He has a history of hypertension and hyperlipidemia, presented to the emergency department on 05/20/2016 with chest pain that began approximately 15 minutes prior to arrival.  He had worked out earlier that day without symptoms.  Later in the day, he developed chest discomfort which resolved on its own.  Approximately 2 hours later, he again developed more severe chest pain and presented to the emergency department where EKG revealed inferior STEMI.  He was given a heparin bolus and nitroglycerin with improvement in pain and taken to the cath lab which revealed large dominant RCA with 100% distal RCA occlusion and 80% proximal RCA occlusion.    He underwent successful PTCA and stenting of the distal and mid RCA with implantation of 2 overlapping 3.0 x 20 and 3.0 x 28 mm Synergy DES and stenting of the proximal to mid segment of the right coronary artery with a 3.5 x 12 mm Synergy DES, stenosis reduced to 0%.  Due to high dose of contrast and extended period of radiation exposure required for procedure, he was kept in the hospital until this morning and for observation for contrast-induced nephropathy or radiation injury. No recurrence of chest pain during hospitalization. Echocardiogram revealed preserved LVEF.   Recommendations on discharge: Follow up outpatient in 10-14 days. Continue dual antiplatelet therapy and high-dose statin therapy for now.  Discharge Exam: Blood pressure 104/67, pulse 62, temperature  99.3 F (37.4 C), temperature source Oral, resp. rate 18, height 5\' 10"  (1.778 m), weight 84.5 kg (186 lb 4.6 oz), SpO2 95 %.    General appearance: alert, cooperative, appears  stated age and no distress Resp: clear to auscultation bilaterally Cardio: regular rate and rhythm, S1, S2 normal, no murmur, click, rub or gallop GI: soft, non-tender; bowel sounds normal; no masses,  no organomegaly Extremities: extremities normal, atraumatic, no cyanosis or edema Neurologic: Grossly normal  Labs:   Lab Results  Component Value Date   WBC 9.0 05/21/2016   HGB 12.4* 05/21/2016   HCT 36.8* 05/21/2016   MCV 90.4 05/21/2016   PLT 172 05/21/2016    Recent Labs Lab 05/20/16 1953  05/21/16 0528  NA 138  < > 139  K 3.3*  < > 4.2  CL 99*  < > 106  CO2 26  < > 25  BUN 20  < > 15  CREATININE 1.37*  < > 1.25*  CALCIUM 9.9  < > 8.2*  PROT 7.6  --   --   BILITOT 0.8  --   --   ALKPHOS 53  --   --   ALT 34  --   --   AST 34  --   --   GLUCOSE 119*  < > 106*  < > = values in this interval not displayed.  Lipid Panel     Component Value Date/Time   CHOL 144 05/21/2016 0049   TRIG 13 05/21/2016 0049   HDL 50 05/21/2016 0049   CHOLHDL 2.9 05/21/2016 0049   VLDL 3 05/21/2016 0049   LDLCALC 91 05/21/2016 0049    BNP (last 3 results) No results for input(s): BNP in the last 8760 hours.  HEMOGLOBIN A1C No results found for: HGBA1C, MPG  Cardiac Panel (last 3 results)  Recent Labs  05/21/16 0049 05/21/16 0528 05/21/16 1204  TROPONINI >65.00* >65.00* >65.00*    Lab Results  Component Value Date   TROPONINI >65.00* 05/21/2016     TSH No results for input(s): TSH in the last 8760 hours.  EKG:  05/20/2016: S. Bradycardia. Inferior ST elevation, Ant ST depression. Acute inferior, lateral  and posterior MI.  05/21/2016: Idioventricular rhythm this morning.  Repeat EKG: Sinus rhythm, marked sinus bradycardia, inferior infarct old, anterior infarct old. Nonspecific T abnormality.  Radiology: Dg Chest Port 1 View  05/20/2016  CLINICAL DATA:  Acute onset of chest pain and shortness of breath with nausea. EXAM: PORTABLE CHEST 1 VIEW COMPARISON:  None.  FINDINGS: The cardiac silhouette, mediastinal and hilar contours are within normal limits. The lungs are clear of acute process. Minimal left basilar scarring changes or subsegmental atelectasis. The bony thorax is intact. IMPRESSION: Minimal streaky left basilar scarring or atelectasis but no infiltrates, edema or effusions. Electronically Signed   By: Rudie Meyer M.D.   On: 05/20/2016 20:09   FOLLOW UP PLANS AND APPOINTMENTS Discharge Instructions    Amb Referral to Cardiac Rehabilitation    Complete by:  As directed   Diagnosis:   STEMI Coronary Stents              Medication List    STOP taking these medications        hydrochlorothiazide 25 MG tablet  Commonly known as:  HYDRODIURIL      TAKE these medications        aspirin 81 MG chewable tablet  Chew 1 tablet (81 mg total) by mouth daily.  atorvastatin 80 MG tablet  Commonly known as:  LIPITOR  Take 1 tablet (80 mg total) by mouth daily at 6 PM.     CALCIUM 600/VITAMIN D3 600-800 MG-UNIT Tabs  Generic drug:  Calcium Carb-Cholecalciferol  Take 1 tablet by mouth daily.     carvedilol 3.125 MG tablet  Commonly known as:  COREG  Take 1 tablet (3.125 mg total) by mouth 2 (two) times daily with a meal.     cyanocobalamin 500 MCG tablet  Take 1,000 mcg by mouth daily.     dexlansoprazole 60 MG capsule  Commonly known as:  DEXILANT  Take 60 mg by mouth once a week.     famotidine 20 MG tablet  Commonly known as:  PEPCID  Take 20 mg by mouth 2 (two) times daily.     Fish Oil 1000 MG Caps  Take 1,000 mg by mouth daily.     glucosamine-chondroitin 500-400 MG tablet  Take 1 tablet by mouth 3 (three) times daily.     losartan 25 MG tablet  Commonly known as:  COZAAR  Take 1 tablet (25 mg total) by mouth daily.     multivitamin with minerals Tabs tablet  Take 1 tablet by mouth daily.     nitroGLYCERIN 0.4 MG SL tablet  Commonly known as:  NITROSTAT  Place 1 tablet (0.4 mg total) under the tongue every 5  (five) minutes as needed for chest pain.     ticagrelor 90 MG Tabs tablet  Commonly known as:  BRILINTA  Take 1 tablet (90 mg total) by mouth 2 (two) times daily.     vitamin C 500 MG tablet  Commonly known as:  ASCORBIC ACID  Take 1,000 mg by mouth daily.     Vitamin D-3 1000 units Caps  Take 1,000 Units by mouth daily.           Follow-up Information    Follow up with Yates Decamp, MD. Schedule an appointment as soon as possible for a visit in 2 weeks.   Specialty:  Cardiology   Contact information:   [address] [place] [address] [phone]      Yates Decamp, MD 05/22/2016, 7:03 AM [facility], [place] Pager: [phone] Office: [phone] If no answer: [phone]

## 2016-05-22 NOTE — Progress Notes (Signed)
CARDIAC REHAB PHASE I   Pt for discharge today, states he has been ambulating independently with no complaints. Pt declines additional ambulation at this time. Pt states he has no questions regarding education at this time. Pt in bed, call bell within reach, awaiting discharge.     Joylene GrapesEmily C Mccall Will, RN, BSN 05/22/2016 10:08 AM

## 2016-05-30 DIAGNOSIS — I252 Old myocardial infarction: Secondary | ICD-10-CM | POA: Diagnosis not present

## 2016-05-30 DIAGNOSIS — Z9861 Coronary angioplasty status: Secondary | ICD-10-CM | POA: Diagnosis not present

## 2016-05-30 DIAGNOSIS — I2510 Atherosclerotic heart disease of native coronary artery without angina pectoris: Secondary | ICD-10-CM | POA: Diagnosis not present

## 2016-05-30 NOTE — Outcomes Assessment (Signed)
Patient had received significant amount of radiation due to complex procedure.  Patient has not had any skin burn or any skin irritation or any obvious complications at follow-up.  I will continue to monitor.

## 2016-05-30 NOTE — Progress Notes (Signed)
Patient had received significant amount of radiation due to complex procedure.  Patient has not had any skin burn or any skin irritation or any obvious complications at follow-up.  I will continue to monitor. 

## 2016-06-03 DIAGNOSIS — I2510 Atherosclerotic heart disease of native coronary artery without angina pectoris: Secondary | ICD-10-CM | POA: Diagnosis not present

## 2016-06-03 DIAGNOSIS — I213 ST elevation (STEMI) myocardial infarction of unspecified site: Secondary | ICD-10-CM | POA: Diagnosis not present

## 2016-06-11 DIAGNOSIS — G4733 Obstructive sleep apnea (adult) (pediatric): Secondary | ICD-10-CM | POA: Diagnosis not present

## 2016-06-20 ENCOUNTER — Encounter (HOSPITAL_COMMUNITY): Payer: Self-pay

## 2016-06-20 ENCOUNTER — Encounter (HOSPITAL_COMMUNITY)
Admission: RE | Admit: 2016-06-20 | Discharge: 2016-06-20 | Disposition: A | Discharge status: Home / Self Care | Payer: BLUE CROSS/BLUE SHIELD | Source: Ambulatory Visit | Attending: Cardiology | Admitting: Cardiology

## 2016-06-20 VITALS — BP 110/78 | HR 57 | Ht 68.5 in | Wt 178.1 lb

## 2016-06-20 DIAGNOSIS — Z955 Presence of coronary angioplasty implant and graft: Secondary | ICD-10-CM | POA: Diagnosis not present

## 2016-06-20 DIAGNOSIS — I2119 ST elevation (STEMI) myocardial infarction involving other coronary artery of inferior wall: Secondary | ICD-10-CM | POA: Insufficient documentation

## 2016-06-20 DIAGNOSIS — I213 ST elevation (STEMI) myocardial infarction of unspecified site: Secondary | ICD-10-CM | POA: Diagnosis present

## 2016-06-20 DIAGNOSIS — I2111 ST elevation (STEMI) myocardial infarction involving right coronary artery: Secondary | ICD-10-CM

## 2016-06-20 NOTE — Progress Notes (Signed)
Cardiac Individual Treatment Plan  Patient Details  Name: Dan [name] MRN: [id number] Date of Birth: 1956/10/02 Referring Provider:        CARDIAC REHAB PHASE [facility]   Referring Provider  Delrae RendGanji,Jagadeesh, MD      Initial Encounter Date:       CARDIAC REHAB PHASE II ORIENTATION [facility]   Date  06/20/16   Referring Provider  Delrae RendGanji,Jagadeesh, MD      Visit Diagnosis: ST elevation (STEMI) myocardial infarction involving right coronary artery [facility](HCC)  Status post coronary artery stent placement  Patient's Home Medications on Admission:  Current outpatient prescriptions:  .  aspirin 81 MG chewable tablet, Chew 1 tablet (81 mg total) by mouth daily., Disp: 30 tablet, Rfl: 0 .  atorvastatin (LIPITOR) 80 MG tablet, Take 1 tablet (80 mg total) by mouth daily at 6 PM., Disp: 30 tablet, Rfl: 1 .  Calcium Carb-Cholecalciferol (CALCIUM 600/VITAMIN D3) 600-800 MG-UNIT TABS, Take 1 tablet by mouth daily., Disp: , Rfl:  .  carvedilol (COREG) 3.125 MG tablet, Take 1 tablet (3.125 mg total) by mouth 2 (two) times daily with a meal., Disp: 60 tablet, Rfl: 1 .  cyanocobalamin 500 MCG tablet, Take 1,000 mcg by mouth daily. , Disp: , Rfl:  .  famotidine (PEPCID) 20 MG tablet, Take 20 mg by mouth 2 (two) times daily., Disp: , Rfl:  .  glucosamine-chondroitin 500-400 MG tablet, Take 1 tablet by mouth 3 (three) times daily., Disp: , Rfl:  .  losartan (COZAAR) 100 MG tablet, Take 100 mg by mouth daily., Disp: , Rfl:  .  Multiple Vitamin (MULTIVITAMIN WITH MINERALS) TABS tablet, Take 1 tablet by mouth daily., Disp: , Rfl:  .  nitroGLYCERIN (NITROSTAT) 0.4 MG SL tablet, Place 1 tablet (0.4 mg total) under the tongue every 5 (five) minutes as needed for chest pain., Disp: 25 tablet, Rfl: 12 .  Omega-3 Fatty Acids (FISH OIL) 1000 MG CAPS, Take 1,000 mg by mouth daily. , Disp: , Rfl:  .  ticagrelor  (BRILINTA) 90 MG TABS tablet, Take 1 tablet (90 mg total) by mouth 2 (two) times daily., Disp: 60 tablet, Rfl: 0 .  vitamin C (ASCORBIC ACID) 500 MG tablet, Take 1,000 mg by mouth daily. , Disp: , Rfl:  .  Cholecalciferol (VITAMIN D-3) 1000 units CAPS, Take 1,000 Units by mouth daily., Disp: , Rfl:   Past Medical History: Past Medical History  Diagnosis Date  . Hypertension   . GERD (gastroesophageal reflux disease)   . History of hiatal hernia   . Left ACL tear   . Hyperlipidemia     Tobacco Use: History  Smoking status  . Never Smoker   Smokeless tobacco  . Not on file    Labs: Recent Review Flowsheet Data    Labs for ITP Cardiac and Pulmonary Rehab Latest Ref Rng 05/20/2016 05/21/2016   Cholestrol 0 - 200 mg/dL 324199 401144   LDLCALC 0 - 99 mg/dL 027(O124(H) 91   HDL >53>40 mg/dL 61 50   Trlycerides <664<150 mg/dL 72 13   TCO2 0 - 403100 mmol/L 27 -      Capillary Blood Glucose: No results found for: GLUCAP   Exercise Target Goals: Date: 06/20/16  Exercise Program Goal: Individual exercise prescription set with THRR, safety & activity barriers. Participant demonstrates ability to understand and report RPE using BORG scale, to self-measure pulse accurately, and to acknowledge the importance  of the exercise prescription.  Exercise Prescription Goal: Starting with aerobic activity 30 plus minutes a day, 3 days per week for initial exercise prescription. Provide home exercise prescription and guidelines that participant acknowledges understanding prior to discharge.  Activity Barriers & Risk Stratification:     Activity Barriers & Cardiac Risk Stratification - 06/20/16 0924    Activity Barriers & Cardiac Risk Stratification   Activity Barriers Other (comment)   Comments retinal surgery, L knee (ACL surgery) and torn R tricep   Cardiac Risk Stratification High      6 Minute Walk:     6 Minute Walk      06/20/16 1544       6 Minute Walk   Phase Initial     Distance 1822 feet      Walk Time 6 minutes     # of Rest Breaks 0     MPH 3.4     METS 4.5     RPE 11     VO2 Peak 15.6     Symptoms No     Resting HR 57 bpm     Resting BP 110/70 mmHg     Max Ex. HR 98 bpm     Max Ex. BP 122/60 mmHg     2 Minute Post BP 106/64 mmHg        Initial Exercise Prescription:     Initial Exercise Prescription - 06/20/16 1500    Date of Initial Exercise RX and Referring Provider   Date 06/20/16   Referring Provider Delrae RendGanji,Jagadeesh, MD   Treadmill   MPH 2.4   Grade 0   Minutes 10   METs 2.84   Bike   Level 1   Minutes 10   METs 3.22   NuStep   Level 3   Minutes 10   METs 2   Prescription Details   Frequency (times per week) 3   Duration Progress to 30 minutes of continuous aerobic without signs/symptoms of physical distress   Intensity   THRR 40-80% of Max Heartrate 65-131   Ratings of Perceived Exertion 11-13   Perceived Dyspnea 0-4   Progression   Progression Continue to progress workloads to maintain intensity without signs/symptoms of physical distress.   Resistance Training   Training Prescription Yes   Weight 3lbs   Reps 10-12      Perform Capillary Blood Glucose checks as needed.  Exercise Prescription Changes:   Exercise Comments:   Discharge Exercise Prescription (Final Exercise Prescription Changes):   Nutrition:  Target Goals: Understanding of nutrition guidelines, daily intake of sodium 1500mg , cholesterol 200mg , calories 30% from fat and 7% or less from saturated fats, daily to have 5 or more servings of fruits and vegetables.  Biometrics:     Pre Biometrics - 06/20/16 1602    Pre Biometrics   Waist Circumference 38.5 inches   Hip Circumference 38.5 inches   Waist to Hip Ratio 1 %   Triceps Skinfold 8 mm   % Body Fat 23.5 %   Grip Strength 34 kg   Flexibility 13.25 in   Single Leg Stand 30 seconds       Nutrition Therapy Plan and Nutrition Goals:     Nutrition Therapy & Goals - 06/20/16 1509    Nutrition  Therapy   Diet Therapeutic Lifestyle Changes   Personal Nutrition Goals   Personal Goal #1 Maintain current wt around 186 lb at graduation from Cardiac Rehab   Intervention Plan   Intervention Prescribe, educate  and counsel regarding individualized specific dietary modifications aiming towards targeted core components such as weight, hypertension, lipid management, diabetes, heart failure and other comorbidities.   Expected Outcomes Short Term Goal: Understand basic principles of dietary content, such as calories, fat, sodium, cholesterol and nutrients.;Long Term Goal: Adherence to prescribed nutrition plan.      Nutrition Discharge: Nutrition Scores:     Nutrition Assessments - 06/20/16 1509    MEDFICTS Scores   Pre Score 13      Nutrition Goals Re-Evaluation:   Psychosocial: Target Goals: Acknowledge presence or absence of depression, maximize coping skills, provide positive support system. Participant is able to verbalize types and ability to use techniques and skills needed for reducing stress and depression.  Initial Review & Psychosocial Screening:   Quality of Life Scores:     Quality of Life - 06/20/16 1603    Quality of Life Scores   Health/Function Pre 23.4 %   Socioeconomic Pre 19.44 %   Psych/Spiritual Pre 24 %   Family Pre 25.2 %   GLOBAL Pre 22.87 %      PHQ-9:     Recent Review Flowsheet Data    There is no flowsheet data to display.      Psychosocial Evaluation and Intervention:   Psychosocial Re-Evaluation:   Vocational Rehabilitation: Provide vocational rehab assistance to qualifying candidates.   Vocational Rehab Evaluation & Intervention:     Vocational Rehab - 06/20/16 1630    Initial Vocational Rehab Evaluation & Intervention   Assessment shows need for Vocational Rehabilitation No      Education: Education Goals: Education classes will be provided on a weekly basis, covering required topics. Participant will state  understanding/return demonstration of topics presented.  Learning Barriers/Preferences:     Learning Barriers/Preferences - 06/20/16 1539    Learning Barriers/Preferences   Learning Barriers Sight   Learning Preferences Skilled Demonstration;Pictoral      Education Topics: Count Your Pulse:  -Group instruction provided by verbal instruction, demonstration, patient participation and written materials to support subject.  Instructors address importance of being able to find your pulse and how to count your pulse when at home without a heart monitor.  Patients get hands on experience counting their pulse with staff help and individually.   Heart Attack, Angina, and Risk Factor Modification:  -Group instruction provided by verbal instruction, video, and written materials to support subject.  Instructors address signs and symptoms of angina and heart attacks.    Also discuss risk factors for heart disease and how to make changes to improve heart health risk factors.   Functional Fitness:  -Group instruction provided by verbal instruction, demonstration, patient participation, and written materials to support subject.  Instructors address safety measures for doing things around the house.  Discuss how to get up and down off the floor, how to pick things up properly, how to safely get out of a chair without assistance, and balance training.   Meditation and Mindfulness:  -Group instruction provided by verbal instruction, patient participation, and written materials to support subject.  Instructor addresses importance of mindfulness and meditation practice to help reduce stress and improve awareness.  Instructor also leads participants through a meditation exercise.    Stretching for Flexibility and Mobility:  -Group instruction provided by verbal instruction, patient participation, and written materials to support subject.  Instructors lead participants through series of stretches that are  designed to increase flexibility thus improving mobility.  These stretches are additional exercise for major muscle groups that  are typically performed during regular warm up and cool down.   Hands Only CPR Anytime:  -Group instruction provided by verbal instruction, video, patient participation and written materials to support subject.  Instructors co-teach with AHA video for hands only CPR.  Participants get hands on experience with mannequins.   Nutrition I class: Heart Healthy Eating:  -Group instruction provided by PowerPoint slides, verbal discussion, and written materials to support subject matter. The instructor gives an explanation and review of the Therapeutic Lifestyle Changes diet recommendations, which includes a discussion on lipid goals, dietary fat, sodium, fiber, plant stanol/sterol esters, sugar, and the components of a well-balanced, healthy diet.   Nutrition II class: Lifestyle Skills:  -Group instruction provided by PowerPoint slides, verbal discussion, and written materials to support subject matter. The instructor gives an explanation and review of label reading, grocery shopping for heart health, heart healthy recipe modifications, and ways to make healthier choices when eating out.   Diabetes Question & Answer:  -Group instruction provided by PowerPoint slides, verbal discussion, and written materials to support subject matter. The instructor gives an explanation and review of diabetes co-morbidities, pre- and post-prandial blood glucose goals, pre-exercise blood glucose goals, signs, symptoms, and treatment of hypoglycemia and hyperglycemia, and foot care basics.   Diabetes Blitz:  -Group instruction provided by PowerPoint slides, verbal discussion, and written materials to support subject matter. The instructor gives an explanation and review of the physiology behind type 1 and type 2 diabetes, diabetes medications and rational behind using different medications, pre-  and post-prandial blood glucose recommendations and Hemoglobin A1c goals, diabetes diet, and exercise including blood glucose guidelines for exercising safely.    Portion Distortion:  -Group instruction provided by PowerPoint slides, verbal discussion, written materials, and food models to support subject matter. The instructor gives an explanation of serving size versus portion size, changes in portions sizes over the last 20 years, and what consists of a serving from each food group.   Stress Management:  -Group instruction provided by verbal instruction, video, and written materials to support subject matter.  Instructors review role of stress in heart disease and how to cope with stress positively.     Exercising on Your Own:  -Group instruction provided by verbal instruction, power point, and written materials to support subject.  Instructors discuss benefits of exercise, components of exercise, frequency and intensity of exercise, and end points for exercise.  Also discuss use of nitroglycerin and activating EMS.  Review options of places to exercise outside of rehab.  Review guidelines for sex with heart disease.   Cardiac Drugs I:  -Group instruction provided by verbal instruction and written materials to support subject.  Instructor reviews cardiac drug classes: antiplatelets, anticoagulants, beta blockers, and statins.  Instructor discusses reasons, side effects, and lifestyle considerations for each drug class.   Cardiac Drugs II:  -Group instruction provided by verbal instruction and written materials to support subject.  Instructor reviews cardiac drug classes: angiotensin converting enzyme inhibitors (ACE-I), angiotensin II receptor blockers (ARBs), nitrates, and calcium channel blockers.  Instructor discusses reasons, side effects, and lifestyle considerations for each drug class.   Anatomy and Physiology of the Circulatory System:  -Group instruction provided by verbal  instruction, video, and written materials to support subject.  Reviews functional anatomy of heart, how it relates to various diagnoses, and what role the heart plays in the overall system.   Knowledge Questionnaire Score:     Knowledge Questionnaire Score - 06/20/16 1544    Knowledge  Questionnaire Score   Pre Score 20/24      Core Components/Risk Factors/Patient Goals at Admission:     Personal Goals and Risk Factors at Admission - 06/20/16 0925    Core Components/Risk Factors/Patient Goals on Admission   Increase Strength and Stamina Yes   Intervention Develop an individualized exercise prescription for aerobic and resistive training based on initial evaluation findings, risk stratification, comorbidities and participant's personal goals.   Expected Outcomes Achievement of increased cardiorespiratory fitness and enhanced flexibility, muscular endurance and strength shown through measurements of functional capacity and personal statement of participant.   Hypertension Yes   Intervention Provide education on lifestyle modifcations including regular physical activity/exercise, weight management, moderate sodium restriction and increased consumption of fresh fruit, vegetables, and low fat dairy, alcohol moderation, and smoking cessation.;Monitor prescription use compliance.   Expected Outcomes Short Term: Continued assessment and intervention until BP is < 140/70mm HG in hypertensive participants. < 130/50mm HG in hypertensive participants with diabetes, heart failure or chronic kidney disease.;Long Term: Maintenance of blood pressure at goal levels.   Lipids Yes   Intervention Provide education and support for participant on nutrition & aerobic/resistive exercise along with prescribed medications to achieve LDL 70mg , HDL >40mg .   Expected Outcomes Short Term: Participant states understanding of desired cholesterol values and is compliant with medications prescribed. Participant is following  exercise prescription and nutrition guidelines.;Long Term: Cholesterol controlled with medications as prescribed, with individualized exercise RX and with personalized nutrition plan. Value goals: LDL < 70mg , HDL > 40 mg.   Stress Yes   Intervention Offer individual and/or small group education and counseling on adjustment to heart disease, stress management and health-related lifestyle change. Teach and support self-help strategies.;Refer participants experiencing significant psychosocial distress to appropriate mental health specialists for further evaluation and treatment. When possible, include family members and significant others in education/counseling sessions.   Expected Outcomes Short Term: Participant demonstrates changes in health-related behavior, relaxation and other stress management skills, ability to obtain effective social support, and compliance with psychotropic medications if prescribed.;Long Term: Emotional wellbeing is indicated by absence of clinically significant psychosocial distress or social isolation.   Personal Goal Other Yes   Personal Goal shor: get back to running and lifting weights  long: get back to a good cardiobvascular fitness   Intervention Provide education programming to assist with running and  weight lifting to improve cardiovascular fitness   Expected Outcomes Be able to lift weight and run      Core Components/Risk Factors/Patient Goals Review:    Core Components/Risk Factors/Patient Goals at Discharge (Final Review):    ITP Comments:     ITP Comments      06/20/16 0922           ITP Comments Dr. Armanda Magic, Medical Director          Comments:  Pt in today for Cardiac Rehab Orientation from 0800-1030 s/p 05/20/16 stemi and des x 3 rca.  As a part of the orientation appointment, pt completed 6 minute walk test.  Pt monitor showed SR with notched QRS and inverted T wave.  Reviewed office and most recent hospital EKG for comparison.  Evolving  notched QRS noted with t wave inversion.  Pt tolerated well with no complaints of chest pain or sob.  Pt is very eager to get started in the rehab program due to cobra coverage for medical care ending July 31st. 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 Jacinto Halim.  Carlette Olmitz  RN, BSN

## 2016-06-20 NOTE — Progress Notes (Signed)
Cardiac Rehab Medication Review by a Pharmacist  Does the patient  feel that his/her medications are working for him/her?  yes  Has the patient been experiencing any side effects to the medications prescribed?  no  Does the patient measure his/her own blood pressure or blood glucose at home?  yes   Does the patient have any problems obtaining medications due to transportation or finances?   no  Understanding of regimen: good Understanding of indications: fair Potential of compliance: good    Pharmacist comments: 60 yo M presents for medication review. Pt verbalizes some understanding of medications with guidance. He did not express any concerns regarding side effects.    Tenna ChildRenee J Graycee Greeson 06/20/2016 8:59 AM

## 2016-06-24 ENCOUNTER — Encounter (HOSPITAL_COMMUNITY): Payer: Self-pay

## 2016-06-24 ENCOUNTER — Encounter (HOSPITAL_COMMUNITY)
Admission: RE | Admit: 2016-06-24 | Discharge: 2016-06-24 | Disposition: A | Discharge status: Home / Self Care | Payer: BLUE CROSS/BLUE SHIELD | Source: Ambulatory Visit | Attending: Cardiology | Admitting: Cardiology

## 2016-06-24 DIAGNOSIS — Z955 Presence of coronary angioplasty implant and graft: Secondary | ICD-10-CM

## 2016-06-24 DIAGNOSIS — I2119 ST elevation (STEMI) myocardial infarction involving other coronary artery of inferior wall: Secondary | ICD-10-CM | POA: Diagnosis not present

## 2016-06-24 DIAGNOSIS — I2111 ST elevation (STEMI) myocardial infarction involving right coronary artery: Secondary | ICD-10-CM

## 2016-06-24 NOTE — Progress Notes (Signed)
Daily Session Note  Patient Details  Name: Dan [name] MRN: [id number] Date of Birth: 12/06/1956 Referring Provider:        CARDIAC REHAB PHASE II ORIENTATION from 06/20/2016 in [place]   Referring Provider  Kela Millin, MD      Encounter Date: 06/24/2016  Check In:     Session Check In - 06/24/16 0908    Check-In   Location [facility]   Staff Present Dorna Bloom, MS, ACSM RCEP, Exercise Physiologist;Doroteo Nickolson, RN, Clinical cytogeneticist, RN, Deland Pretty, MS, ACSM CEP, Exercise Physiologist   Supervising physician immediately available to respond to emergencies [facility] immediately available   Physician(s) Dr. Marily Memos   Medication changes reported     No   Fall or balance concerns reported    No   Warm-up and Cool-down Performed as group-led instruction   Resistance Training Performed Yes   VAD Patient? No   Pain Assessment   Currently in Pain? No/denies   Multiple Pain Sites No      Capillary Blood Glucose: No results found for this or any previous visit (from the past 24 hour(s)).   Goals Met:  Exercise tolerated well  Goals Unmet:  Not Applicable  Comments: Pt started cardiac rehab today.  Pt tolerated light exercise without difficulty. VSS, telemetry-sinus rhythm, poor R wave progression, asymptomatic.  Medication list reconciled. Pt denies barriers to medicaiton compliance.  PSYCHOSOCIAL ASSESSMENT:  PHQ-0. Pt exhibits positive coping skills, hopeful outlook with supportive family. No psychosocial needs identified at this time, no psychosocial interventions necessary.    Pt enjoys weight lifting, running, spending time with family, traveling and sports.  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  Pt goals for cardiac rehab are to be able to return to running in 1 month. Pt  encouraged to participate in cardiac rehab activities to be able to achieve this goal, including taking prescribed medication and risk factor reduction education classes.   Pt oriented to exercise equipment and routine.    Understanding verbalized.   Dr. Fransico Him is Medical Director for Cardiac Rehab at [facility].

## 2016-06-25 DIAGNOSIS — M25721 Osteophyte, right elbow: Secondary | ICD-10-CM | POA: Diagnosis not present

## 2016-06-26 ENCOUNTER — Encounter (HOSPITAL_COMMUNITY)
Admission: RE | Admit: 2016-06-26 | Discharge: 2016-06-26 | Disposition: A | Discharge status: Home / Self Care | Payer: BLUE CROSS/BLUE SHIELD | Source: Ambulatory Visit | Attending: Cardiology | Admitting: Cardiology

## 2016-06-26 DIAGNOSIS — Z955 Presence of coronary angioplasty implant and graft: Secondary | ICD-10-CM | POA: Diagnosis not present

## 2016-06-26 DIAGNOSIS — I2111 ST elevation (STEMI) myocardial infarction involving right coronary artery: Secondary | ICD-10-CM

## 2016-06-26 DIAGNOSIS — I2119 ST elevation (STEMI) myocardial infarction involving other coronary artery of inferior wall: Secondary | ICD-10-CM | POA: Diagnosis not present

## 2016-06-26 NOTE — Progress Notes (Signed)
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  aspirin 81 MG chewable tablet, Chew 1 tablet (81 mg total) by mouth daily., Disp: 30 tablet, Rfl: 0 .  atorvastatin (LIPITOR) 80 MG tablet, Take 1 tablet (80 mg total) by mouth daily at 6 PM., Disp: 30 tablet, Rfl: 1 .  Calcium Carb-Cholecalciferol (CALCIUM 600/VITAMIN D3) 600-800 MG-UNIT TABS, Take 1 tablet by mouth daily., Disp: , Rfl:  .  carvedilol (COREG) 3.125 MG tablet, Take 1 tablet (3.125 mg total) by mouth 2 (two) times daily with a meal., Disp: 60 tablet, Rfl: 1 .  Cholecalciferol (VITAMIN D-3) 1000 units CAPS, Take 1,000 Units by mouth daily., Disp: , Rfl:  .  cyanocobalamin 500 MCG tablet, Take 1,000 mcg by mouth daily. , Disp: , Rfl:  .  famotidine (PEPCID) 20 MG tablet, Take 20 mg by mouth 2 (two) times daily., Disp: , Rfl:  .  glucosamine-chondroitin 500-400 MG tablet, Take 1 tablet by mouth 3 (three) times daily., Disp: , Rfl:  .  losartan (COZAAR) 100 MG tablet, Take 100 mg by mouth daily., Disp: , Rfl:  .  Multiple Vitamin (MULTIVITAMIN WITH MINERALS) TABS tablet, Take 1 tablet by mouth daily., Disp: , Rfl:  .  nitroGLYCERIN (NITROSTAT) 0.4 MG SL tablet, Place 1 tablet (0.4 mg total) under the tongue every 5 (five) minutes as needed for chest pain., Disp: 25 tablet, Rfl: 12 .   Omega-3 Fatty Acids (FISH OIL) 1000 MG CAPS, Take 1,000 mg by mouth daily. , Disp: , Rfl:  .  ticagrelor (BRILINTA) 90 MG TABS tablet, Take 1 tablet (90 mg total) by mouth 2 (two) times daily., Disp: 60 tablet, Rfl: 0 .  vitamin C (ASCORBIC ACID) 500 MG tablet, Take 1,000 mg by mouth daily. , Disp: , Rfl:   Past Medical History: Past Medical History  Diagnosis Date  . Hypertension   . GERD (gastroesophageal reflux disease)   . History of hiatal hernia   . Left ACL tear   . Hyperlipidemia     Tobacco Use: History  Smoking status  . Never Smoker   Smokeless tobacco  . Not on file    Labs:     Recent Review Flowsheet Data    Labs for ITP Cardiac and Pulmonary Rehab Latest Ref Rng 05/20/2016 05/21/2016   Cholestrol 0 - 200 mg/dL 829 562   LDLCALC 0 - 99 mg/dL 130(Q) 91   HDL >65 mg/dL 61 50   Trlycerides <784 mg/dL 72 13   TCO2 0 - 696 mmol/L 27 -      Capillary Blood Glucose: No results found for: GLUCAP   Exercise Target Goals:    Exercise Program Goal: Individual exercise prescription set with THRR, safety & activity barriers. Participant demonstrates ability to understand and report RPE using BORG scale, to self-measure pulse accurately, and  to acknowledge the importance of the exercise prescription.  Exercise Prescription Goal: Starting with aerobic activity 30 plus minutes a day, 3 days per week for initial exercise prescription. Provide home exercise prescription and guidelines that participant acknowledges understanding prior to discharge.  Activity Barriers & Risk Stratification:     Activity Barriers & Cardiac Risk Stratification - 06/20/16 0924    Activity Barriers & Cardiac Risk Stratification   Activity Barriers Other (comment)   Comments retinal surgery, L knee (ACL surgery) and torn R tricep   Cardiac Risk Stratification High      6 Minute Walk:     6 Minute Walk      06/20/16 1544       6 Minute Walk   Phase Initial     Distance 1822 feet      Walk Time 6 minutes     # of Rest Breaks 0     MPH 3.4     METS 4.5     RPE 11     VO2 Peak 15.6     Symptoms No     Resting HR 57 bpm     Resting BP 110/70 mmHg     Max Ex. HR 98 bpm     Max Ex. BP 122/60 mmHg     2 Minute Post BP 106/64 mmHg        Initial Exercise Prescription:     Initial Exercise Prescription - 06/20/16 1500    Date of Initial Exercise RX and Referring Provider   Date 06/20/16   Referring Provider Delrae Rend, MD   Treadmill   MPH 2.4   Grade 0   Minutes 10   METs 2.84   Bike   Level 1   Minutes 10   METs 3.22   NuStep   Level 3   Minutes 10   METs 2   Prescription Details   Frequency (times per week) 3   Duration Progress to 30 minutes of continuous aerobic without signs/symptoms of physical distress   Intensity   THRR 40-80% of Max Heartrate 65-131   Ratings of Perceived Exertion 11-13   Perceived Dyspnea 0-4   Progression   Progression Continue to progress workloads to maintain intensity without signs/symptoms of physical distress.   Resistance Training   Training Prescription Yes   Weight 3lbs   Reps 10-12      Perform Capillary Blood Glucose checks as needed.  Exercise Prescription Changes:      Exercise Prescription Changes      07/03/16 1600           Exercise Review   Progression Yes       Response to Exercise   Blood Pressure (Admit) 124/70 mmHg       Blood Pressure (Exercise) 130/70 mmHg       Blood Pressure (Exit) 110/60 mmHg       Heart Rate (Admit) 91 bpm       Heart Rate (Exercise) 144 bpm       Heart Rate (Exit) 91 bpm       Rating of Perceived Exertion (Exercise) 12       Intensity THRR unchanged       Progression   Progression Continue to progress workloads to maintain intensity without signs/symptoms of physical distress.       Average METs 5.3       Resistance Training   Training Prescription Yes       Weight 3lbs  Reps 10-12       Treadmill   MPH 3       Grade 3       Minutes 10        METs 4.54       Bike   Level 1.8       Minutes 10       METs 5.17       NuStep   Level 4       Minutes 10       METs 6.1       Home Exercise Plan   Plans to continue exercise at Home  reviewed on 07/03/16 see progress note       Frequency Add 3 additional days to program exercise sessions.          Exercise Comments:   Discharge Exercise Prescription (Final Exercise Prescription Changes):     Exercise Prescription Changes - 07/03/16 1600    Exercise Review   Progression Yes   Response to Exercise   Blood Pressure (Admit) 124/70 mmHg   Blood Pressure (Exercise) 130/70 mmHg   Blood Pressure (Exit) 110/60 mmHg   Heart Rate (Admit) 91 bpm   Heart Rate (Exercise) 144 bpm   Heart Rate (Exit) 91 bpm   Rating of Perceived Exertion (Exercise) 12   Intensity THRR unchanged   Progression   Progression Continue to progress workloads to maintain intensity without signs/symptoms of physical distress.   Average METs 5.3   Resistance Training   Training Prescription Yes   Weight 3lbs   Reps 10-12   Treadmill   MPH 3   Grade 3   Minutes 10   METs 4.54   Bike   Level 1.8   Minutes 10   METs 5.17   NuStep   Level 4   Minutes 10   METs 6.1   Home Exercise Plan   Plans to continue exercise at Home  reviewed on 07/03/16 see progress note   Frequency Add 3 additional days to program exercise sessions.      Nutrition:  Target Goals: Understanding of nutrition guidelines, daily intake of sodium 1500mg , cholesterol 200mg , calories 30% from fat and 7% or less from saturated fats, daily to have 5 or more servings of fruits and vegetables.  Biometrics:     Pre Biometrics - 06/20/16 1602    Pre Biometrics   Waist Circumference 38.5 inches   Hip Circumference 38.5 inches   Waist to Hip Ratio 1 %   Triceps Skinfold 8 mm   % Body Fat 23.5 %   Grip Strength 34 kg   Flexibility 13.25 in   Single Leg Stand 30 seconds       Nutrition Therapy Plan and Nutrition  Goals:     Nutrition Therapy & Goals - 06/20/16 1509    Nutrition Therapy   Diet Therapeutic Lifestyle Changes   Personal Nutrition Goals   Personal Goal #1 Maintain current wt around 186 lb at graduation from Cardiac Rehab   Intervention Plan   Intervention Prescribe, educate and counsel regarding individualized specific dietary modifications aiming towards targeted core components such as weight, hypertension, lipid management, diabetes, heart failure and other comorbidities.   Expected Outcomes Short Term Goal: Understand basic principles of dietary content, such as calories, fat, sodium, cholesterol and nutrients.;Long Term Goal: Adherence to prescribed nutrition plan.      Nutrition Discharge: Nutrition Scores:     Nutrition Assessments - 06/20/16 1509    MEDFICTS Scores  Pre Score 13      Nutrition Goals Re-Evaluation:   Psychosocial: Target Goals: Acknowledge presence or absence of depression, maximize coping skills, provide positive support system. Participant is able to verbalize types and ability to use techniques and skills needed for reducing stress and depression.  Initial Review & Psychosocial Screening:     Initial Psych Review & Screening - 06/24/16 0921    Initial Review   Current issues with Current Stress Concerns   Family Dynamics   Good Support System? Yes   Barriers   Psychosocial barriers to participate in program The patient should benefit from training in stress management and relaxation.   Screening Interventions   Interventions Encouraged to exercise      Quality of Life Scores:     Quality of Life - 06/20/16 1603    Quality of Life Scores   Health/Function Pre 23.4 %   Socioeconomic Pre 19.44 %   Psych/Spiritual Pre 24 %   Family Pre 25.2 %   GLOBAL Pre 22.87 %      PHQ-9:     Recent Review Flowsheet Data    Depression screen [facility] 2/9 06/24/2016   Decreased Interest 0   Down, Depressed, Hopeless 0   PHQ - 2 Score 0       Psychosocial Evaluation and Intervention:     Psychosocial Evaluation - 06/26/16 1743    Psychosocial Evaluation & Interventions   Interventions Relaxation education;Encouraged to exercise with the program and follow exercise prescription;Stress management education   Comments pt exhibits type A behaviors and would benefit from stress management and relaxation techniques.     Continued Psychosocial Services Needed Yes      Psychosocial Re-Evaluation:   Vocational Rehabilitation: Provide vocational rehab assistance to qualifying candidates.   Vocational Rehab Evaluation & Intervention:     Vocational Rehab - 06/20/16 1630    Initial Vocational Rehab Evaluation & Intervention   Assessment shows need for Vocational Rehabilitation No      Education: Education Goals: Education classes will be provided on a weekly basis, covering required topics. Participant will state understanding/return demonstration of topics presented.  Learning Barriers/Preferences:     Learning Barriers/Preferences - 06/20/16 1539    Learning Barriers/Preferences   Learning Barriers Sight   Learning Preferences Skilled Demonstration;Pictoral      Education Topics: Count Your Pulse:  -Group instruction provided by verbal instruction, demonstration, patient participation and written materials to support subject.  Instructors address importance of being able to find your pulse and how to count your pulse when at home without a heart monitor.  Patients get hands on experience counting their pulse with staff help and individually.   Heart Attack, Angina, and Risk Factor Modification:  -Group instruction provided by verbal instruction, video, and written materials to support subject.  Instructors address signs and symptoms of angina and heart attacks.    Also discuss risk factors for heart disease and how to make changes to improve heart health risk factors.   Functional Fitness:  -Group instruction  provided by verbal instruction, demonstration, patient participation, and written materials to support subject.  Instructors address safety measures for doing things around the house.  Discuss how to get up and down off the floor, how to pick things up properly, how to safely get out of a chair without assistance, and balance training.   Meditation and Mindfulness:  -Group instruction provided by verbal instruction, patient participation, and written materials to support subject.  Instructor addresses importance of mindfulness  and meditation practice to help reduce stress and improve awareness.  Instructor also leads participants through a meditation exercise.    Stretching for Flexibility and Mobility:  -Group instruction provided by verbal instruction, patient participation, and written materials to support subject.  Instructors lead participants through series of stretches that are designed to increase flexibility thus improving mobility.  These stretches are additional exercise for major muscle groups that are typically performed during regular warm up and cool down.   Hands Only CPR Anytime:  -Group instruction provided by verbal instruction, video, patient participation and written materials to support subject.  Instructors co-teach with AHA video for hands only CPR.  Participants get hands on experience with mannequins.   Nutrition I class: Heart Healthy Eating:  -Group instruction provided by PowerPoint slides, verbal discussion, and written materials to support subject matter. The instructor gives an explanation and review of the Therapeutic Lifestyle Changes diet recommendations, which includes a discussion on lipid goals, dietary fat, sodium, fiber, plant stanol/sterol esters, sugar, and the components of a well-balanced, healthy diet.   Nutrition II class: Lifestyle Skills:  -Group instruction provided by PowerPoint slides, verbal discussion, and written materials to support subject  matter. The instructor gives an explanation and review of label reading, grocery shopping for heart health, heart healthy recipe modifications, and ways to make healthier choices when eating out.   Diabetes Question & Answer:  -Group instruction provided by PowerPoint slides, verbal discussion, and written materials to support subject matter. The instructor gives an explanation and review of diabetes co-morbidities, pre- and post-prandial blood glucose goals, pre-exercise blood glucose goals, signs, symptoms, and treatment of hypoglycemia and hyperglycemia, and foot care basics.   Diabetes Blitz:  -Group instruction provided by PowerPoint slides, verbal discussion, and written materials to support subject matter. The instructor gives an explanation and review of the physiology behind type 1 and type 2 diabetes, diabetes medications and rational behind using different medications, pre- and post-prandial blood glucose recommendations and Hemoglobin A1c goals, diabetes diet, and exercise including blood glucose guidelines for exercising safely.    Portion Distortion:  -Group instruction provided by PowerPoint slides, verbal discussion, written materials, and food models to support subject matter. The instructor gives an explanation of serving size versus portion size, changes in portions sizes over the last 20 years, and what consists of a serving from each food group.   Stress Management:  -Group instruction provided by verbal instruction, video, and written materials to support subject matter.  Instructors review role of stress in heart disease and how to cope with stress positively.     Exercising on Your Own:  -Group instruction provided by verbal instruction, power point, and written materials to support subject.  Instructors discuss benefits of exercise, components of exercise, frequency and intensity of exercise, and end points for exercise.  Also discuss use of nitroglycerin and activating  EMS.  Review options of places to exercise outside of rehab.  Review guidelines for sex with heart disease.   Cardiac Drugs I:  -Group instruction provided by verbal instruction and written materials to support subject.  Instructor reviews cardiac drug classes: antiplatelets, anticoagulants, beta blockers, and statins.  Instructor discusses reasons, side effects, and lifestyle considerations for each drug class.   Cardiac Drugs II:  -Group instruction provided by verbal instruction and written materials to support subject.  Instructor reviews cardiac drug classes: angiotensin converting enzyme inhibitors (ACE-I), angiotensin II receptor blockers (ARBs), nitrates, and calcium channel blockers.  Instructor discusses reasons, side effects, and  lifestyle considerations for each drug class.   Anatomy and Physiology of the Circulatory System:  -Group instruction provided by verbal instruction, video, and written materials to support subject.  Reviews functional anatomy of heart, how it relates to various diagnoses, and what role the heart plays in the overall system.   Knowledge Questionnaire Score:     Knowledge Questionnaire Score - 06/20/16 1544    Knowledge Questionnaire Score   Pre Score 20/24      Core Components/Risk Factors/Patient Goals at Admission:     Personal Goals and Risk Factors at Admission - 06/20/16 0925    Core Components/Risk Factors/Patient Goals on Admission   Increase Strength and Stamina Yes   Intervention Develop an individualized exercise prescription for aerobic and resistive training based on initial evaluation findings, risk stratification, comorbidities and participant's personal goals.   Expected Outcomes Achievement of increased cardiorespiratory fitness and enhanced flexibility, muscular endurance and strength shown through measurements of functional capacity and personal statement of participant.   Hypertension Yes   Intervention Provide education on  lifestyle modifcations including regular physical activity/exercise, weight management, moderate sodium restriction and increased consumption of fresh fruit, vegetables, and low fat dairy, alcohol moderation, and smoking cessation.;Monitor prescription use compliance.   Expected Outcomes Short Term: Continued assessment and intervention until BP is < 140/29mm HG in hypertensive participants. < 130/65mm HG in hypertensive participants with diabetes, heart failure or chronic kidney disease.;Long Term: Maintenance of blood pressure at goal levels.   Lipids Yes   Intervention Provide education and support for participant on nutrition & aerobic/resistive exercise along with prescribed medications to achieve LDL 70mg , HDL >40mg .   Expected Outcomes Short Term: Participant states understanding of desired cholesterol values and is compliant with medications prescribed. Participant is following exercise prescription and nutrition guidelines.;Long Term: Cholesterol controlled with medications as prescribed, with individualized exercise RX and with personalized nutrition plan. Value goals: LDL < , HDL > 40 mg.   Stress Yes   Intervention Offer individual and/or small group education and counseling on adjustment to heart disease, stress management and health-related lifestyle change. Teach and support self-help strategies.;Refer participants experiencing significant psychosocial distress to appropriate mental health specialists for further evaluation and treatment. When possible, include family members and significant others in education/counseling sessions.   Expected Outcomes Short Term: Participant demonstrates changes in health-related behavior, relaxation and other stress management skills, ability to obtain effective social support, and compliance with psychotropic medications if prescribed.;Long Term: Emotional wellbeing is indicated by absence of clinically significant psychosocial distress or social  isolation.   Personal Goal Other Yes   Personal Goal shor: get back to running and lifting weights  long: get back to a good cardiobvascular fitness   Intervention Provide education programming to assist with running and  weight lifting to improve cardiovascular fitness   Expected Outcomes Be able to lift weight and run      Core Components/Risk Factors/Patient Goals Review:    Core Components/Risk Factors/Patient Goals at Discharge (Final Review):    ITP Comments:     ITP Comments      06/20/16 0922 06/26/16 1743         ITP Comments Dr. Armanda Magic, Medical Director Dr. Armanda Magic, Medical Director          Comments: Pt is making expected progress toward personal goals after completing 5 sessions. Recommend continued exercise and life style modification education including  stress management and relaxation techniques to decrease cardiac risk profile.

## 2016-06-28 ENCOUNTER — Encounter (HOSPITAL_COMMUNITY)
Admission: RE | Admit: 2016-06-28 | Discharge: 2016-06-28 | Disposition: A | Discharge status: Home / Self Care | Payer: BLUE CROSS/BLUE SHIELD | Source: Ambulatory Visit | Attending: Cardiology | Admitting: Cardiology

## 2016-06-28 DIAGNOSIS — I2111 ST elevation (STEMI) myocardial infarction involving right coronary artery: Secondary | ICD-10-CM

## 2016-06-28 DIAGNOSIS — Z955 Presence of coronary angioplasty implant and graft: Secondary | ICD-10-CM

## 2016-06-28 DIAGNOSIS — I2119 ST elevation (STEMI) myocardial infarction involving other coronary artery of inferior wall: Secondary | ICD-10-CM | POA: Diagnosis not present

## 2016-07-01 ENCOUNTER — Encounter (HOSPITAL_COMMUNITY)
Admission: RE | Admit: 2016-07-01 | Discharge: 2016-07-01 | Disposition: A | Discharge status: Home / Self Care | Payer: BLUE CROSS/BLUE SHIELD | Source: Ambulatory Visit | Attending: Cardiology | Admitting: Cardiology

## 2016-07-01 DIAGNOSIS — Z955 Presence of coronary angioplasty implant and graft: Secondary | ICD-10-CM | POA: Diagnosis not present

## 2016-07-01 DIAGNOSIS — I2119 ST elevation (STEMI) myocardial infarction involving other coronary artery of inferior wall: Secondary | ICD-10-CM | POA: Diagnosis not present

## 2016-07-01 DIAGNOSIS — I2111 ST elevation (STEMI) myocardial infarction involving right coronary artery: Secondary | ICD-10-CM

## 2016-07-03 ENCOUNTER — Encounter (HOSPITAL_COMMUNITY)
Admission: RE | Admit: 2016-07-03 | Discharge: 2016-07-03 | Disposition: A | Discharge status: Home / Self Care | Payer: BLUE CROSS/BLUE SHIELD | Source: Ambulatory Visit | Attending: Cardiology | Admitting: Cardiology

## 2016-07-03 DIAGNOSIS — Z955 Presence of coronary angioplasty implant and graft: Secondary | ICD-10-CM

## 2016-07-03 DIAGNOSIS — I2119 ST elevation (STEMI) myocardial infarction involving other coronary artery of inferior wall: Secondary | ICD-10-CM | POA: Diagnosis not present

## 2016-07-03 DIAGNOSIS — I2111 ST elevation (STEMI) myocardial infarction involving right coronary artery: Secondary | ICD-10-CM

## 2016-07-03 NOTE — Progress Notes (Signed)
Reviewed home exercise with pt today.  Pt plans to walk on treadmill and use handheld weights and machine weights at [facility] for exercise. Pt will exercise 3x/week in addition to coming to [facility]. Reviewed THR, pulse, RPE, sign and symptoms, NTG use, and when to call 911 or MD.  Also discussed weather considerations and indoor options.  Pt voiced understanding.     Mykenzi Vanzile [employer],MS,ACSM RCEP

## 2016-07-05 ENCOUNTER — Encounter (HOSPITAL_COMMUNITY)
Admission: RE | Admit: 2016-07-05 | Discharge: 2016-07-05 | Disposition: A | Discharge status: Home / Self Care | Payer: BLUE CROSS/BLUE SHIELD | Source: Ambulatory Visit | Attending: Cardiology | Admitting: Cardiology

## 2016-07-05 DIAGNOSIS — Z955 Presence of coronary angioplasty implant and graft: Secondary | ICD-10-CM | POA: Diagnosis not present

## 2016-07-05 DIAGNOSIS — I2119 ST elevation (STEMI) myocardial infarction involving other coronary artery of inferior wall: Secondary | ICD-10-CM | POA: Diagnosis not present

## 2016-07-05 DIAGNOSIS — I2111 ST elevation (STEMI) myocardial infarction involving right coronary artery: Secondary | ICD-10-CM

## 2016-07-08 ENCOUNTER — Encounter (HOSPITAL_COMMUNITY)
Admission: RE | Admit: 2016-07-08 | Discharge: 2016-07-08 | Disposition: A | Discharge status: Home / Self Care | Payer: BLUE CROSS/BLUE SHIELD | Source: Ambulatory Visit | Attending: Cardiology | Admitting: Cardiology

## 2016-07-08 DIAGNOSIS — I2119 ST elevation (STEMI) myocardial infarction involving other coronary artery of inferior wall: Secondary | ICD-10-CM | POA: Diagnosis not present

## 2016-07-08 DIAGNOSIS — I2111 ST elevation (STEMI) myocardial infarction involving right coronary artery: Secondary | ICD-10-CM

## 2016-07-08 DIAGNOSIS — Z955 Presence of coronary angioplasty implant and graft: Secondary | ICD-10-CM

## 2016-07-10 ENCOUNTER — Encounter (HOSPITAL_COMMUNITY)
Admission: RE | Admit: 2016-07-10 | Discharge: 2016-07-10 | Disposition: A | Discharge status: Home / Self Care | Payer: BLUE CROSS/BLUE SHIELD | Source: Ambulatory Visit | Attending: Cardiology | Admitting: Cardiology

## 2016-07-10 DIAGNOSIS — I2119 ST elevation (STEMI) myocardial infarction involving other coronary artery of inferior wall: Secondary | ICD-10-CM | POA: Diagnosis not present

## 2016-07-10 DIAGNOSIS — Z955 Presence of coronary angioplasty implant and graft: Secondary | ICD-10-CM | POA: Diagnosis not present

## 2016-07-10 DIAGNOSIS — K219 Gastro-esophageal reflux disease without esophagitis: Secondary | ICD-10-CM | POA: Diagnosis not present

## 2016-07-10 DIAGNOSIS — I77810 Thoracic aortic ectasia: Secondary | ICD-10-CM | POA: Diagnosis not present

## 2016-07-10 DIAGNOSIS — I2510 Atherosclerotic heart disease of native coronary artery without angina pectoris: Secondary | ICD-10-CM | POA: Diagnosis not present

## 2016-07-10 DIAGNOSIS — I2111 ST elevation (STEMI) myocardial infarction involving right coronary artery: Secondary | ICD-10-CM

## 2016-07-12 ENCOUNTER — Encounter (HOSPITAL_COMMUNITY)
Admission: RE | Admit: 2016-07-12 | Discharge: 2016-07-12 | Disposition: A | Discharge status: Home / Self Care | Payer: BLUE CROSS/BLUE SHIELD | Source: Ambulatory Visit | Attending: Cardiology | Admitting: Cardiology

## 2016-07-12 VITALS — Ht 68.5 in | Wt 179.5 lb

## 2016-07-12 DIAGNOSIS — I2119 ST elevation (STEMI) myocardial infarction involving other coronary artery of inferior wall: Secondary | ICD-10-CM | POA: Diagnosis not present

## 2016-07-12 DIAGNOSIS — I2111 ST elevation (STEMI) myocardial infarction involving right coronary artery: Secondary | ICD-10-CM

## 2016-07-12 DIAGNOSIS — Z955 Presence of coronary angioplasty implant and graft: Secondary | ICD-10-CM

## 2016-07-12 NOTE — Progress Notes (Signed)
Discharge Summary  Patient Details  Name: Dan [name] MRN: [id number] Date of Birth: 03/13/1956 Referring Provider:   Flowsheet Row CARDIAC REHAB PHASE II ORIENTATION from 06/20/2016 in [facility] CARDIAC [facility]  Referring Provider  Delrae Rend, MD       Number of Visits: 10  Reason for Discharge:  Patient independent in their exercise.  Pt exiting program early due to feeling independent with exercise goals.   Smoking History:  History  Smoking Status  . Never Smoker  Smokeless Tobacco  . 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 (HCC)  Status post coronary artery stent placement  ADL UCSD:   Initial Exercise Prescription:     Initial Exercise Prescription - 06/20/16 1500      Date of Initial Exercise RX and Referring Provider   Date 06/20/16   Referring Provider Delrae Rend, MD     Treadmill   MPH 2.4   Grade 0   Minutes 10   METs 2.84     Bike   Level 1   Minutes 10   METs 3.22     NuStep   Level 3   Minutes 10   METs 2     Prescription Details   Frequency (times per week) 3   Duration Progress to 30 minutes of continuous aerobic without signs/symptoms of physical distress     Intensity   THRR 40-80% of Max Heartrate 65-131   Ratings of Perceived Exertion 11-13   Perceived Dyspnea 0-4     Progression   Progression Continue to progress workloads to maintain intensity without signs/symptoms of physical distress.     Resistance Training   Training Prescription Yes   Weight 3lbs   Reps 10-12      Discharge Exercise Prescription (Final Exercise Prescription Changes):     Exercise Prescription Changes - 07/12/16 1600      Exercise Review   Progression Yes     Response to Exercise   Blood Pressure (Admit) 128/80   Blood Pressure (Exercise) 154/88   Blood Pressure (Exit) 120/80   Heart Rate (Admit) 81 bpm   Heart Rate (Exercise) 150 bpm   Heart Rate  (Exit) 87 bpm   Rating of Perceived Exertion (Exercise) 12   Intensity THRR unchanged     Progression   Progression Continue to progress workloads to maintain intensity without signs/symptoms of physical distress.   Average METs 5.9     Resistance Training   Training Prescription Yes   Weight 5lbs   Reps 10-12     Treadmill   MPH 3.5   Grade 5   Minutes 10   METs 5.37     Bike   Level 1.8   Minutes 10   METs 5.17     NuStep   Level 5   Minutes 10   METs 6.5     Home Exercise Plan   Plans to continue exercise at Home  reviewed on 07/03/16 see progress note   Frequency Add 3 additional days to program exercise sessions.      Functional Capacity:     6 Minute Walk    Row Name 06/20/16 1544 07/12/16 1620       6 Minute Walk   Phase Initial Discharge    Distance 1822 feet 3000 feet    Distance % Change  - 64.65 %    Walk Time 6 minutes 6 minutes    # of Rest Breaks  0 0    MPH 3.4 5.7    METS 4.5 7.7    RPE 11 12    VO2 Peak 15.6 26.8    Symptoms No No    Resting HR 57 bpm 84 bpm    Resting BP 110/70 116/80    Max Ex. HR 98 bpm 155 bpm    Max Ex. BP 122/60 174/80  rchk 134 80    2 Minute Post BP 106/64 118/72       Psychological, QOL, Others - Outcomes: PHQ 2/9: Depression screen PHQ 2/9 06/24/2016  Decreased Interest 0  Down, Depressed, Hopeless 0  PHQ - 2 Score 0    Quality of Life:     Quality of Life - 07/12/16 0753      Quality of Life Scores   Health/Function Pre 23.4 %   Health/Function Post 26.7 %   Health/Function % Change 14.1 %   Socioeconomic Pre 19.4 %   Socioeconomic Post 22 %   Socioeconomic % Change  13.4 %   Psych/Spiritual Pre 24 %   Psych/Spiritual Post 24 %   Psych/Spiritual % Change 0 %   Family Pre 25.2 %   Family Post 25.9 %   Family % Change 2.78 %   GLOBAL Pre 22.87 %   GLOBAL Post 25.06 %  pt congratulated on improved quality of life scores. pt states he feels more confident with exercising safely with CAD  and is ready to exercise on his own.    GLOBAL % Change 9.58 %      Personal Goals: Goals established at orientation with interventions provided to work toward goal.     Personal Goals and Risk Factors at Admission - 06/20/16 0925      Core Components/Risk Factors/Patient Goals on Admission   Increase Strength and Stamina Yes   Intervention Develop an individualized exercise prescription for aerobic and resistive training based on initial evaluation findings, risk stratification, comorbidities and participant's personal goals.   Expected Outcomes Achievement of increased cardiorespiratory fitness and enhanced flexibility, muscular endurance and strength shown through measurements of functional capacity and personal statement of participant.   Hypertension Yes   Intervention Provide education on lifestyle modifcations including regular physical activity/exercise, weight management, moderate sodium restriction and increased consumption of fresh fruit, vegetables, and low fat dairy, alcohol moderation, and smoking cessation.;Monitor prescription use compliance.   Expected Outcomes Short Term: Continued assessment and intervention until BP is < 140/2mm HG in hypertensive participants. < 130/32mm HG in hypertensive participants with diabetes, heart failure or chronic kidney disease.;Long Term: Maintenance of blood pressure at goal levels.   Lipids Yes   Intervention Provide education and support for participant on nutrition & aerobic/resistive exercise along with prescribed medications to achieve LDL 70mg , HDL >40mg .   Expected Outcomes Short Term: Participant states understanding of desired cholesterol values and is compliant with medications prescribed. Participant is following exercise prescription and nutrition guidelines.;Long Term: Cholesterol controlled with medications as prescribed, with individualized exercise RX and with personalized nutrition plan. Value goals: LDL < 70mg , HDL > 40 mg.    Stress Yes   Intervention Offer individual and/or small group education and counseling on adjustment to heart disease, stress management and health-related lifestyle change. Teach and support self-help strategies.;Refer participants experiencing significant psychosocial distress to appropriate mental health specialists for further evaluation and treatment. When possible, include family members and significant others in education/counseling sessions.   Expected Outcomes Short Term: Participant demonstrates changes in health-related behavior, relaxation and other  stress management skills, ability to obtain effective social support, and compliance with psychotropic medications if prescribed.;Long Term: Emotional wellbeing is indicated by absence of clinically significant psychosocial distress or social isolation.   Personal Goal Other Yes   Personal Goal shor: get back to running and lifting weights  long: get back to a good cardiobvascular fitness   Intervention Provide education programming to assist with running and  weight lifting to improve cardiovascular fitness   Expected Outcomes Be able to lift weight and run       Personal Goals Discharge:     Goals and Risk Factor Review    Row Name 07/12/16 1628             Core Components/Risk Factors/Patient Goals Review   Personal Goals Review Other       Review pt was unable to run and lift weights in cardiac rehab due to having writen clearance from cardiologist. Cardiologist did clear to run and lift weights. Discussed with pt lifting and running progression       Expected Outcomes Pt will start a running program and lift light weights. Pt is aware of activity progressions and feels as though he is equipped to exercise on his own.          Nutrition & Weight - Outcomes:     Pre Biometrics - 06/20/16 1602      Pre Biometrics   Waist Circumference 38.5 inches   Hip Circumference 38.5 inches   Waist to Hip Ratio 1 %   Triceps  Skinfold 8 mm   % Body Fat 23.5 %   Grip Strength 34 kg   Flexibility 13.25 in   Single Leg Stand 30 seconds         Post Biometrics - 07/12/16 1623       Post  Biometrics   Height 5' 8.5" (1.74 m)   Weight 179 lb 10.8 oz (81.5 kg)   Waist Circumference 37 inches   Hip Circumference 37.75 inches   Waist to Hip Ratio 0.98 %   BMI (Calculated) 27   Triceps Skinfold 8 mm   % Body Fat 22.9 %   Grip Strength 40 kg   Flexibility 13 in   Single Leg Stand 30 seconds      Nutrition:     Nutrition Therapy & Goals - 06/20/16 1509      Nutrition Therapy   Diet Therapeutic Lifestyle Changes     Personal Nutrition Goals   Personal Goal #1 Maintain current wt around 186 lb at graduation from Cardiac Rehab     Intervention Plan   Intervention Prescribe, educate and counsel regarding individualized specific dietary modifications aiming towards targeted core components such as weight, hypertension, lipid management, diabetes, heart failure and other comorbidities.   Expected Outcomes Short Term Goal: Understand basic principles of dietary content, such as calories, fat, sodium, cholesterol and nutrients.;Long Term Goal: Adherence to prescribed nutrition plan.      Nutrition Discharge:     Nutrition Assessments - 06/20/16 1509      MEDFICTS Scores   Pre Score 13      Education Questionnaire Score:     Knowledge Questionnaire Score - 07/12/16 1617      Knowledge Questionnaire Score   Post Score 23/24      Goals reviewed with patient; copy given to patient.

## 2016-07-15 ENCOUNTER — Encounter (HOSPITAL_COMMUNITY): Payer: BLUE CROSS/BLUE SHIELD

## 2016-07-17 ENCOUNTER — Encounter (HOSPITAL_COMMUNITY): Payer: BLUE CROSS/BLUE SHIELD

## 2016-07-17 DIAGNOSIS — G4733 Obstructive sleep apnea (adult) (pediatric): Secondary | ICD-10-CM | POA: Diagnosis not present

## 2016-07-19 ENCOUNTER — Encounter (HOSPITAL_COMMUNITY): Payer: BLUE CROSS/BLUE SHIELD

## 2016-07-22 ENCOUNTER — Encounter (HOSPITAL_COMMUNITY): Payer: BLUE CROSS/BLUE SHIELD

## 2016-07-24 ENCOUNTER — Encounter (HOSPITAL_COMMUNITY): Payer: BLUE CROSS/BLUE SHIELD

## 2016-07-26 ENCOUNTER — Encounter (HOSPITAL_COMMUNITY): Payer: BLUE CROSS/BLUE SHIELD

## 2016-07-29 ENCOUNTER — Encounter (HOSPITAL_COMMUNITY): Payer: BLUE CROSS/BLUE SHIELD

## 2016-07-31 ENCOUNTER — Encounter (HOSPITAL_COMMUNITY): Payer: BLUE CROSS/BLUE SHIELD

## 2016-08-02 ENCOUNTER — Encounter (HOSPITAL_COMMUNITY): Payer: BLUE CROSS/BLUE SHIELD

## 2016-08-05 ENCOUNTER — Encounter (HOSPITAL_COMMUNITY): Payer: BLUE CROSS/BLUE SHIELD

## 2016-08-07 ENCOUNTER — Encounter (HOSPITAL_COMMUNITY): Payer: BLUE CROSS/BLUE SHIELD

## 2016-08-09 ENCOUNTER — Encounter (HOSPITAL_COMMUNITY): Payer: BLUE CROSS/BLUE SHIELD

## 2016-08-12 ENCOUNTER — Encounter (HOSPITAL_COMMUNITY): Payer: BLUE CROSS/BLUE SHIELD

## 2016-08-14 ENCOUNTER — Encounter (HOSPITAL_COMMUNITY): Payer: BLUE CROSS/BLUE SHIELD

## 2016-08-16 ENCOUNTER — Encounter (HOSPITAL_COMMUNITY): Payer: BLUE CROSS/BLUE SHIELD

## 2016-08-21 ENCOUNTER — Encounter (HOSPITAL_COMMUNITY): Payer: BLUE CROSS/BLUE SHIELD

## 2016-08-23 ENCOUNTER — Encounter (HOSPITAL_COMMUNITY): Payer: BLUE CROSS/BLUE SHIELD

## 2016-08-26 ENCOUNTER — Encounter (HOSPITAL_COMMUNITY): Payer: BLUE CROSS/BLUE SHIELD

## 2016-08-28 ENCOUNTER — Encounter (HOSPITAL_COMMUNITY): Payer: BLUE CROSS/BLUE SHIELD

## 2016-08-30 ENCOUNTER — Encounter (HOSPITAL_COMMUNITY): Payer: BLUE CROSS/BLUE SHIELD

## 2016-09-02 ENCOUNTER — Encounter (HOSPITAL_COMMUNITY): Payer: BLUE CROSS/BLUE SHIELD

## 2016-09-04 ENCOUNTER — Encounter (HOSPITAL_COMMUNITY): Payer: BLUE CROSS/BLUE SHIELD

## 2016-09-06 ENCOUNTER — Encounter (HOSPITAL_COMMUNITY): Payer: BLUE CROSS/BLUE SHIELD

## 2016-09-06 NOTE — Addendum Note (Signed)
Encounter addended by: Nery Frappier D Juleah Paradise on: 09/06/2016 11:00 AM<BR>    Actions taken: Visit Navigator Flowsheet section accepted

## 2016-09-09 ENCOUNTER — Encounter (HOSPITAL_COMMUNITY): Payer: BLUE CROSS/BLUE SHIELD

## 2016-09-11 ENCOUNTER — Encounter (HOSPITAL_COMMUNITY): Payer: BLUE CROSS/BLUE SHIELD

## 2016-09-13 ENCOUNTER — Encounter (HOSPITAL_COMMUNITY): Payer: BLUE CROSS/BLUE SHIELD

## 2016-09-16 ENCOUNTER — Encounter (HOSPITAL_COMMUNITY): Payer: BLUE CROSS/BLUE SHIELD

## 2016-09-18 ENCOUNTER — Encounter (HOSPITAL_COMMUNITY): Payer: BLUE CROSS/BLUE SHIELD

## 2016-09-20 ENCOUNTER — Encounter (HOSPITAL_COMMUNITY): Payer: BLUE CROSS/BLUE SHIELD

## 2016-09-23 ENCOUNTER — Encounter (HOSPITAL_COMMUNITY): Payer: BLUE CROSS/BLUE SHIELD

## 2016-09-25 ENCOUNTER — Encounter (HOSPITAL_COMMUNITY): Payer: BLUE CROSS/BLUE SHIELD

## 2016-09-27 ENCOUNTER — Encounter (HOSPITAL_COMMUNITY): Payer: BLUE CROSS/BLUE SHIELD

## 2016-09-30 ENCOUNTER — Encounter (HOSPITAL_COMMUNITY): Payer: BLUE CROSS/BLUE SHIELD

## 2016-10-02 ENCOUNTER — Encounter (HOSPITAL_COMMUNITY): Payer: BLUE CROSS/BLUE SHIELD

## 2016-10-04 ENCOUNTER — Encounter (HOSPITAL_COMMUNITY): Payer: BLUE CROSS/BLUE SHIELD

## 2016-10-07 ENCOUNTER — Encounter (HOSPITAL_COMMUNITY): Payer: BLUE CROSS/BLUE SHIELD

## 2016-10-09 ENCOUNTER — Encounter (HOSPITAL_COMMUNITY): Payer: BLUE CROSS/BLUE SHIELD

## 2016-10-11 ENCOUNTER — Encounter (HOSPITAL_COMMUNITY): Payer: BLUE CROSS/BLUE SHIELD

## 2016-10-14 ENCOUNTER — Encounter (HOSPITAL_COMMUNITY): Payer: BLUE CROSS/BLUE SHIELD

## 2016-10-16 ENCOUNTER — Encounter (HOSPITAL_COMMUNITY): Payer: BLUE CROSS/BLUE SHIELD

## 2016-10-18 ENCOUNTER — Encounter (HOSPITAL_COMMUNITY): Payer: BLUE CROSS/BLUE SHIELD

## 2016-10-21 ENCOUNTER — Encounter (HOSPITAL_COMMUNITY): Payer: BLUE CROSS/BLUE SHIELD

## 2016-10-29 DIAGNOSIS — Z23 Encounter for immunization: Secondary | ICD-10-CM | POA: Diagnosis not present

## 2017-01-14 DIAGNOSIS — I77810 Thoracic aortic ectasia: Secondary | ICD-10-CM | POA: Diagnosis not present

## 2017-01-15 DIAGNOSIS — G4733 Obstructive sleep apnea (adult) (pediatric): Secondary | ICD-10-CM | POA: Diagnosis not present

## 2017-01-30 DIAGNOSIS — Q231 Congenital insufficiency of aortic valve: Secondary | ICD-10-CM | POA: Diagnosis not present

## 2017-01-30 DIAGNOSIS — K219 Gastro-esophageal reflux disease without esophagitis: Secondary | ICD-10-CM | POA: Diagnosis not present

## 2017-01-30 DIAGNOSIS — E785 Hyperlipidemia, unspecified: Secondary | ICD-10-CM | POA: Diagnosis not present

## 2017-01-30 DIAGNOSIS — I77810 Thoracic aortic ectasia: Secondary | ICD-10-CM | POA: Diagnosis not present

## 2017-01-30 DIAGNOSIS — I2510 Atherosclerotic heart disease of native coronary artery without angina pectoris: Secondary | ICD-10-CM | POA: Diagnosis not present

## 2017-02-17 DIAGNOSIS — S01502A Unspecified open wound of oral cavity, initial encounter: Secondary | ICD-10-CM | POA: Diagnosis not present

## 2017-06-02 DIAGNOSIS — Z0000 Encounter for general adult medical examination without abnormal findings: Secondary | ICD-10-CM | POA: Diagnosis not present

## 2017-06-02 DIAGNOSIS — I10 Essential (primary) hypertension: Secondary | ICD-10-CM | POA: Diagnosis not present

## 2017-06-02 DIAGNOSIS — E7800 Pure hypercholesterolemia, unspecified: Secondary | ICD-10-CM | POA: Diagnosis not present

## 2017-06-09 DIAGNOSIS — Z23 Encounter for immunization: Secondary | ICD-10-CM | POA: Diagnosis not present

## 2017-06-09 DIAGNOSIS — Z0000 Encounter for general adult medical examination without abnormal findings: Secondary | ICD-10-CM | POA: Diagnosis not present

## 2017-06-16 DIAGNOSIS — H5213 Myopia, bilateral: Secondary | ICD-10-CM | POA: Diagnosis not present

## 2017-06-16 DIAGNOSIS — H33302 Unspecified retinal break, left eye: Secondary | ICD-10-CM | POA: Diagnosis not present

## 2017-07-03 DIAGNOSIS — Z1211 Encounter for screening for malignant neoplasm of colon: Secondary | ICD-10-CM | POA: Diagnosis not present

## 2017-07-03 DIAGNOSIS — K219 Gastro-esophageal reflux disease without esophagitis: Secondary | ICD-10-CM | POA: Diagnosis not present

## 2017-08-05 DIAGNOSIS — G4733 Obstructive sleep apnea (adult) (pediatric): Secondary | ICD-10-CM | POA: Diagnosis not present

## 2017-08-20 DIAGNOSIS — E785 Hyperlipidemia, unspecified: Secondary | ICD-10-CM | POA: Diagnosis not present

## 2017-08-20 DIAGNOSIS — I77810 Thoracic aortic ectasia: Secondary | ICD-10-CM | POA: Diagnosis not present

## 2017-08-20 DIAGNOSIS — Q231 Congenital insufficiency of aortic valve: Secondary | ICD-10-CM | POA: Diagnosis not present

## 2017-08-20 DIAGNOSIS — I2510 Atherosclerotic heart disease of native coronary artery without angina pectoris: Secondary | ICD-10-CM | POA: Diagnosis not present

## 2017-09-30 DIAGNOSIS — K621 Rectal polyp: Secondary | ICD-10-CM | POA: Diagnosis not present

## 2017-09-30 DIAGNOSIS — Z1211 Encounter for screening for malignant neoplasm of colon: Secondary | ICD-10-CM | POA: Diagnosis not present

## 2017-10-14 DIAGNOSIS — I2510 Atherosclerotic heart disease of native coronary artery without angina pectoris: Secondary | ICD-10-CM | POA: Diagnosis not present

## 2017-10-14 DIAGNOSIS — I77810 Thoracic aortic ectasia: Secondary | ICD-10-CM | POA: Diagnosis not present

## 2017-10-21 DIAGNOSIS — H9390 Unspecified disorder of ear, unspecified ear: Secondary | ICD-10-CM | POA: Diagnosis not present

## 2017-10-21 DIAGNOSIS — J069 Acute upper respiratory infection, unspecified: Secondary | ICD-10-CM | POA: Diagnosis not present

## 2017-12-01 DIAGNOSIS — G4733 Obstructive sleep apnea (adult) (pediatric): Secondary | ICD-10-CM | POA: Diagnosis not present

## 2018-03-17 DIAGNOSIS — Z111 Encounter for screening for respiratory tuberculosis: Secondary | ICD-10-CM | POA: Diagnosis not present

## 2018-04-20 DIAGNOSIS — F4322 Adjustment disorder with anxiety: Secondary | ICD-10-CM | POA: Diagnosis not present

## 2018-04-21 DIAGNOSIS — Z23 Encounter for immunization: Secondary | ICD-10-CM | POA: Diagnosis not present

## 2018-04-27 DIAGNOSIS — F4322 Adjustment disorder with anxiety: Secondary | ICD-10-CM | POA: Diagnosis not present

## 2018-04-29 DIAGNOSIS — F4322 Adjustment disorder with anxiety: Secondary | ICD-10-CM | POA: Diagnosis not present

## 2018-05-04 DIAGNOSIS — F4322 Adjustment disorder with anxiety: Secondary | ICD-10-CM | POA: Diagnosis not present

## 2018-05-06 DIAGNOSIS — F4322 Adjustment disorder with anxiety: Secondary | ICD-10-CM | POA: Diagnosis not present

## 2018-05-14 DIAGNOSIS — F4322 Adjustment disorder with anxiety: Secondary | ICD-10-CM | POA: Diagnosis not present

## 2018-05-19 DIAGNOSIS — F4322 Adjustment disorder with anxiety: Secondary | ICD-10-CM | POA: Diagnosis not present

## 2018-05-25 DIAGNOSIS — G4733 Obstructive sleep apnea (adult) (pediatric): Secondary | ICD-10-CM | POA: Diagnosis not present

## 2018-05-26 DIAGNOSIS — F4322 Adjustment disorder with anxiety: Secondary | ICD-10-CM | POA: Diagnosis not present

## 2018-05-28 DIAGNOSIS — F4322 Adjustment disorder with anxiety: Secondary | ICD-10-CM | POA: Diagnosis not present

## 2018-06-02 DIAGNOSIS — F4322 Adjustment disorder with anxiety: Secondary | ICD-10-CM | POA: Diagnosis not present

## 2018-06-04 DIAGNOSIS — F4322 Adjustment disorder with anxiety: Secondary | ICD-10-CM | POA: Diagnosis not present

## 2018-06-09 DIAGNOSIS — F4322 Adjustment disorder with anxiety: Secondary | ICD-10-CM | POA: Diagnosis not present

## 2018-06-11 DIAGNOSIS — F4322 Adjustment disorder with anxiety: Secondary | ICD-10-CM | POA: Diagnosis not present

## 2018-06-15 DIAGNOSIS — F4322 Adjustment disorder with anxiety: Secondary | ICD-10-CM | POA: Diagnosis not present

## 2018-07-21 DIAGNOSIS — I10 Essential (primary) hypertension: Secondary | ICD-10-CM | POA: Diagnosis not present

## 2018-07-21 DIAGNOSIS — I2510 Atherosclerotic heart disease of native coronary artery without angina pectoris: Secondary | ICD-10-CM | POA: Diagnosis not present

## 2018-07-21 DIAGNOSIS — Z0000 Encounter for general adult medical examination without abnormal findings: Secondary | ICD-10-CM | POA: Diagnosis not present

## 2018-07-21 DIAGNOSIS — Z125 Encounter for screening for malignant neoplasm of prostate: Secondary | ICD-10-CM | POA: Diagnosis not present

## 2018-08-20 DIAGNOSIS — H5213 Myopia, bilateral: Secondary | ICD-10-CM | POA: Diagnosis not present

## 2018-08-20 DIAGNOSIS — H2513 Age-related nuclear cataract, bilateral: Secondary | ICD-10-CM | POA: Diagnosis not present

## 2018-08-24 DIAGNOSIS — R944 Abnormal results of kidney function studies: Secondary | ICD-10-CM | POA: Diagnosis not present

## 2018-09-15 DIAGNOSIS — I712 Thoracic aortic aneurysm, without rupture: Secondary | ICD-10-CM | POA: Diagnosis not present

## 2018-09-15 DIAGNOSIS — I2510 Atherosclerotic heart disease of native coronary artery without angina pectoris: Secondary | ICD-10-CM | POA: Diagnosis not present

## 2018-09-15 DIAGNOSIS — E785 Hyperlipidemia, unspecified: Secondary | ICD-10-CM | POA: Diagnosis not present

## 2018-09-15 DIAGNOSIS — Q231 Congenital insufficiency of aortic valve: Secondary | ICD-10-CM | POA: Diagnosis not present

## 2018-09-16 ENCOUNTER — Other Ambulatory Visit: Payer: Self-pay | Admitting: Cardiology

## 2018-09-16 DIAGNOSIS — I7121 Aneurysm of the ascending aorta, without rupture: Secondary | ICD-10-CM

## 2018-09-16 DIAGNOSIS — I712 Thoracic aortic aneurysm, without rupture: Secondary | ICD-10-CM

## 2018-09-21 DIAGNOSIS — F4322 Adjustment disorder with anxiety: Secondary | ICD-10-CM | POA: Diagnosis not present

## 2018-09-22 ENCOUNTER — Other Ambulatory Visit: Payer: BLUE CROSS/BLUE SHIELD

## 2018-09-28 DIAGNOSIS — F4322 Adjustment disorder with anxiety: Secondary | ICD-10-CM | POA: Diagnosis not present

## 2018-09-29 ENCOUNTER — Ambulatory Visit
Admission: RE | Admit: 2018-09-29 | Discharge: 2018-09-29 | Disposition: A | Discharge status: Home / Self Care | Payer: BLUE CROSS/BLUE SHIELD | Source: Ambulatory Visit | Attending: Cardiology | Admitting: Cardiology

## 2018-09-29 ENCOUNTER — Other Ambulatory Visit: Payer: BLUE CROSS/BLUE SHIELD

## 2018-09-29 ENCOUNTER — Other Ambulatory Visit: Payer: Self-pay

## 2018-09-29 DIAGNOSIS — I7121 Aneurysm of the ascending aorta, without rupture: Secondary | ICD-10-CM

## 2018-09-29 DIAGNOSIS — I712 Thoracic aortic aneurysm, without rupture: Secondary | ICD-10-CM

## 2018-09-29 DIAGNOSIS — G4733 Obstructive sleep apnea (adult) (pediatric): Secondary | ICD-10-CM | POA: Diagnosis not present

## 2018-09-29 DIAGNOSIS — I719 Aortic aneurysm of unspecified site, without rupture: Secondary | ICD-10-CM | POA: Diagnosis not present

## 2018-09-29 MED ORDER — IOPAMIDOL (ISOVUE-370) INJECTION 76%
75.0000 mL | Freq: Once | INTRAVENOUS | Status: AC | PRN
  Administered 2018-09-29: 75 mL via INTRAVENOUS

## 2019-01-01 DIAGNOSIS — R05 Cough: Secondary | ICD-10-CM | POA: Diagnosis not present

## 2019-02-05 DIAGNOSIS — G4733 Obstructive sleep apnea (adult) (pediatric): Secondary | ICD-10-CM | POA: Diagnosis not present

## 2019-04-06 ENCOUNTER — Other Ambulatory Visit: Payer: Self-pay | Admitting: Cardiology

## 2019-04-06 DIAGNOSIS — I712 Thoracic aortic aneurysm, without rupture: Secondary | ICD-10-CM

## 2019-04-06 DIAGNOSIS — I7121 Aneurysm of the ascending aorta, without rupture: Secondary | ICD-10-CM

## 2019-04-09 DIAGNOSIS — K219 Gastro-esophageal reflux disease without esophagitis: Secondary | ICD-10-CM | POA: Diagnosis not present

## 2019-05-12 DIAGNOSIS — G4733 Obstructive sleep apnea (adult) (pediatric): Secondary | ICD-10-CM | POA: Diagnosis not present

## 2019-07-16 DIAGNOSIS — M791 Myalgia, unspecified site: Secondary | ICD-10-CM | POA: Diagnosis not present

## 2019-08-24 DIAGNOSIS — H2513 Age-related nuclear cataract, bilateral: Secondary | ICD-10-CM | POA: Diagnosis not present

## 2019-08-24 DIAGNOSIS — H524 Presbyopia: Secondary | ICD-10-CM | POA: Diagnosis not present

## 2019-08-24 DIAGNOSIS — H33301 Unspecified retinal break, right eye: Secondary | ICD-10-CM | POA: Diagnosis not present

## 2019-08-25 DIAGNOSIS — G4733 Obstructive sleep apnea (adult) (pediatric): Secondary | ICD-10-CM | POA: Diagnosis not present

## 2019-08-31 DIAGNOSIS — Z0000 Encounter for general adult medical examination without abnormal findings: Secondary | ICD-10-CM | POA: Diagnosis not present

## 2019-08-31 DIAGNOSIS — Z23 Encounter for immunization: Secondary | ICD-10-CM | POA: Diagnosis not present

## 2019-08-31 DIAGNOSIS — E7800 Pure hypercholesterolemia, unspecified: Secondary | ICD-10-CM | POA: Diagnosis not present

## 2019-08-31 DIAGNOSIS — Z125 Encounter for screening for malignant neoplasm of prostate: Secondary | ICD-10-CM | POA: Diagnosis not present

## 2019-08-31 DIAGNOSIS — I10 Essential (primary) hypertension: Secondary | ICD-10-CM | POA: Diagnosis not present

## 2019-09-07 ENCOUNTER — Other Ambulatory Visit: Payer: BLUE CROSS/BLUE SHIELD

## 2019-09-10 ENCOUNTER — Other Ambulatory Visit: Payer: BC Managed Care – PPO

## 2019-09-13 ENCOUNTER — Other Ambulatory Visit: Payer: Self-pay

## 2019-09-13 ENCOUNTER — Ambulatory Visit (INDEPENDENT_AMBULATORY_CARE_PROVIDER_SITE_OTHER): Payer: BC Managed Care – PPO

## 2019-09-13 DIAGNOSIS — I7121 Aneurysm of the ascending aorta, without rupture: Secondary | ICD-10-CM

## 2019-09-13 DIAGNOSIS — I712 Thoracic aortic aneurysm, without rupture: Secondary | ICD-10-CM

## 2019-09-23 ENCOUNTER — Other Ambulatory Visit: Payer: Self-pay

## 2019-09-23 ENCOUNTER — Ambulatory Visit: Payer: BLUE CROSS/BLUE SHIELD | Admitting: Cardiology

## 2019-09-23 ENCOUNTER — Encounter: Payer: Self-pay | Admitting: Cardiology

## 2019-09-23 VITALS — BP 136/85 | HR 80 | Temp 97.7°F | Ht 68.0 in | Wt 185.0 lb

## 2019-09-23 DIAGNOSIS — I712 Thoracic aortic aneurysm, without rupture: Secondary | ICD-10-CM

## 2019-09-23 DIAGNOSIS — E7800 Pure hypercholesterolemia, unspecified: Secondary | ICD-10-CM

## 2019-09-23 DIAGNOSIS — Q231 Congenital insufficiency of aortic valve: Secondary | ICD-10-CM

## 2019-09-23 DIAGNOSIS — I7121 Aneurysm of the ascending aorta, without rupture: Secondary | ICD-10-CM

## 2019-09-23 DIAGNOSIS — I2510 Atherosclerotic heart disease of native coronary artery without angina pectoris: Secondary | ICD-10-CM | POA: Diagnosis not present

## 2019-09-23 DIAGNOSIS — I10 Essential (primary) hypertension: Secondary | ICD-10-CM | POA: Diagnosis not present

## 2019-09-23 MED ORDER — EZETIMIBE 10 MG PO TABS: 10.0000 mg | ORAL_TABLET | Freq: Every day | ORAL | 2 refills | Status: DC

## 2019-09-23 NOTE — Progress Notes (Signed)
 Primary Physician/Referring:  Ross, Charles Alan, MD  Patient ID: Dan [name], male    DOB: 03/31/1956, 63 y.o.   MRN: [id number]  No chief complaint on file.  HPI:    Dan [name]  is a 63 y.o. 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   He is presently doing well and remains essentially asymptomatic, has been exercising on a regular basis. Hasn't used sublingual nitroglycerin.  He brings his labs that were performed recently.  Past Medical History:  Diagnosis Date  . GERD (gastroesophageal reflux disease)   . History of hiatal hernia   . Hyperlipidemia   . Hypertension   . Left ACL tear    Past Surgical History:  Procedure Laterality Date  . CARDIAC CATHETERIZATION N/A 05/20/2016   Procedure: Left Heart Cath and Coronary Angiography;  Surgeon: Jay Ganji, MD;  Location: [facility] INVASIVE CV LAB;  Service: Cardiovascular;  Laterality: N/A;  . CARDIAC CATHETERIZATION N/A 05/20/2016   Procedure: Coronary Stent Intervention;  Surgeon: Jay Ganji, MD;  Location: [facility] INVASIVE CV LAB;  Service: Cardiovascular;  Laterality: N/A;  . HAIR TRANSPLANT    . NASAL SINUS SURGERY     Social History   Socioeconomic History  . Marital status: Married    Spouse name: Not on file  . Number of children: Not on file  . Years of education: Not on file  . Highest education level: Not on file  Occupational History  . Not on file  Social Needs  . Financial resource strain: Not on file  . Food insecurity    Worry: Not on file    Inability: Not on file  . Transportation needs    Medical: Not on file    Non-medical: Not on file  Tobacco Use  .  Smoking status: Never Smoker  Substance and Sexual Activity  . Alcohol use: Yes    Comment: social  . Drug use: No  . Sexual activity: Yes  Lifestyle  . Physical activity    Days per week: Not on file    Minutes per session: Not on file  . Stress: Not on file  Relationships  . Social connections    Talks on phone: Not on file    Gets together: Not on file    Attends religious service: Not on file    Active member of club or organization: Not on file    Attends meetings of clubs or organizations: Not on file    Relationship status: Not on file  . Intimate partner violence    Fear of current or ex partner: Not on file    Emotionally abused: Not on file    Physically abused: Not on file    Forced sexual activity: Not on file  Other Topics Concern  . Not on file  Social History Narrative  . Not on file   ROS  Review of Systems  Constitution: Negative for chills, decreased appetite, malaise/fatigue and weight gain.  Cardiovascular: Negative for dyspnea on exertion, leg swelling and syncope.  Endocrine: Negative for cold intolerance.  Hematologic/Lymphatic: Does not bruise/bleed easily.  Musculoskeletal: Negative for joint swelling.    Gastrointestinal: Negative for abdominal pain, anorexia, change in bowel habit, hematochezia and melena.  Neurological: Negative for headaches and light-headedness.  Psychiatric/Behavioral: Negative for depression and substance abuse.  All other systems reviewed and are negative.  Objective   Vitals with BMI 09/06/2016 07/12/2016 06/20/2016  Height - 5' 8.5" 5' 8.5"  Weight 179 lbs 7 oz 179 lbs 11 oz 178 lbs 2 oz  BMI - 27 26.7  Systolic - - 110  Diastolic - - 78  Pulse - - 57    There were no vitals taken for this visit. There is no height or weight on file to calculate BMI.   Physical Exam  Constitutional: He appears well-developed and well-nourished. No distress.  HENT:  Head: Atraumatic.  Eyes: Conjunctivae are normal.  Neck: Neck  supple. No JVD present. No thyromegaly present.  Cardiovascular: Normal rate, regular rhythm and intact distal pulses. Exam reveals no gallop.  No murmur heard. Normal S1 and normal physiologic S2 splitting, no murmur.  No leg edema, no JVD.  Pulmonary/Chest: Effort normal and breath sounds normal.  Abdominal: Soft. Bowel sounds are normal.  Musculoskeletal: Normal range of motion.  Neurological: He is alert.  Skin: Skin is warm and dry.  Psychiatric: He has a normal mood and affect.   Radiology: No results found.  Laboratory examination:   Labs 09/01/2019: Serum glucose 80 mg, BUN 23, creatinine 1.20, eGFR 62 mL, potassium 4.6, CMP otherwise normal.  Total cholesterol 161, triglycerides 168, HDL 57, LDL 80.  Non-HDL cholesterol 1.  Labs 07/30/2018: Serum glucose 95 mg, BUN 26, creatinine 1.31, eGFR 56 mL, potassium 4.4, CMP otherwise normal.  46.  Total cholesterol 148, triglycerides 78, HDL 53, LDL 79.  Unstable cholesterol 95.  No results for input(s): NA, K, CL, CO2, GLUCOSE, BUN, CREATININE, CALCIUM, GFRNONAA, GFRAA in the last 8760 hours. CMP Latest Ref Rng & Units 05/21/2016 05/21/2016 05/20/2016  Glucose 65 - 99 mg/dL 106(H) 103(H) 117(H)  BUN 6 - 20 mg/dL 15 17 23(H)  Creatinine 0.61 - 1.24 mg/dL 1.25(H) 1.11 1.30(H)  Sodium 135 - 145 mmol/L 139 137 139  Potassium 3.5 - 5.1 mmol/L 4.2 4.0 3.3(L)  Chloride 101 - 111 mmol/L 106 105 98(L)  CO2 22 - 32 mmol/L 25 23 -  Calcium 8.9 - 10.3 mg/dL 8.2(L) 8.2(L) -  Total Protein 6.5 - 8.1 g/dL - - -  Total Bilirubin 0.3 - 1.2 mg/dL - - -  Alkaline Phos 38 - 126 U/L - - -  AST 15 - 41 U/L - - -  ALT 17 - 63 U/L - - -   CBC Latest Ref Rng & Units 05/21/2016 05/20/2016 05/20/2016  WBC 4.0 - 10.5 K/uL 9.0 - 9.7  Hemoglobin 13.0 - 17.0 g/dL 12.4(L) 16.7 16.0  Hematocrit 39.0 - 52.0 % 36.8(L) 49.0 45.8  Platelets 150 - 400 K/uL 172 - 206   Lipid Panel     Component Value Date/Time   CHOL 144 05/21/2016 0049   TRIG 13 05/21/2016 0049   HDL  50 05/21/2016 0049   CHOLHDL 2.9 05/21/2016 0049   VLDL 3 05/21/2016 0049   LDLCALC 91 05/21/2016 0049   HEMOGLOBIN A1C No results found for: HGBA1C, MPG TSH No results for input(s): TSH in the last 8760 hours. Medications and allergies  No Known Allergies   Prior to Admission medications   Medication Sig Start Date End Date Taking? Authorizing Provider  aspirin 81 MG chewable tablet Chew 1 tablet (81 mg total) by mouth daily.   05/22/16   Neldon Labella, NP  atorvastatin (LIPITOR) 80 MG tablet Take 1 tablet (80 mg total) by mouth daily at 6 PM. 05/22/16   Neldon Labella, NP  Calcium Carb-Cholecalciferol (CALCIUM 600/VITAMIN D3) 600-800 MG-UNIT TABS Take 1 tablet by mouth daily.    [provider]  carvedilol (COREG) 3.125 MG tablet Take 1 tablet (3.125 mg total) by mouth 2 (two) times daily with a meal. 05/22/16   Neldon Labella, NP  Cholecalciferol (VITAMIN D-3) 1000 units CAPS Take 1,000 Units by mouth daily.    [provider]  cyanocobalamin 500 MCG tablet Take 1,000 mcg by mouth daily.     [provider]  famotidine (PEPCID) 20 MG tablet Take 20 mg by mouth 2 (two) times daily.    [provider]  glucosamine-chondroitin 500-400 MG tablet Take 1 tablet by mouth 3 (three) times daily.    [provider]  losartan (COZAAR) 100 MG tablet Take 100 mg by mouth daily.    [provider]  Multiple Vitamin (MULTIVITAMIN WITH MINERALS) TABS tablet Take 1 tablet by mouth daily.    [provider]  nitroGLYCERIN (NITROSTAT) 0.4 MG SL tablet Place 1 tablet (0.4 mg total) under the tongue every 5 (five) minutes as needed for chest pain. 05/22/16   Neldon Labella, NP  Omega-3 Fatty Acids (FISH OIL) 1000 MG CAPS Take 1,000 mg by mouth daily.     [provider]  ticagrelor (BRILINTA) 90 MG TABS tablet Take 1 tablet (90 mg total) by mouth 2 (two) times daily. 05/22/16   Neldon Labella, NP  vitamin C (ASCORBIC ACID) 500  MG tablet Take 1,000 mg by mouth daily.     [provider]     Current Outpatient Medications  Medication Instructions  . aspirin 81 mg, Oral, Daily  . atorvastatin (LIPITOR) 80 mg, Oral, Daily-1800  . Calcium Carb-Cholecalciferol (CALCIUM 600/VITAMIN D3) 600-800 MG-UNIT TABS 1 tablet, Oral, Daily  . carvedilol (COREG) 3.125 mg, Oral, 2 times daily with meals  . famotidine (PEPCID) 20 mg, Oral, 2 times daily  . Fish Oil 1,000 mg, Oral, Daily  . glucosamine-chondroitin 500-400 MG tablet 1 tablet, Oral, 3 times daily  . losartan (COZAAR) 100 mg, Oral, Daily  . Multiple Vitamin (MULTIVITAMIN WITH MINERALS) TABS tablet 1 tablet, Oral, Daily  . nitroGLYCERIN (NITROSTAT) 0.4 mg, Sublingual, Every 5 min PRN  . ticagrelor (BRILINTA) 90 mg, Oral, 2 times daily  . vitamin B-12 (CYANOCOBALAMIN) 1,000 mcg, Oral, Daily  . vitamin C (ASCORBIC ACID) 1,000 mg, Oral, Daily  . Vitamin D-3 1,000 Units, Oral, Daily    Cardiac Studies:   Coronary Angiogram 05/20/2016: 2 overlapping 3.0 x 20 and 3.0 x 28 mm Synergy DES and stenting of the proximal to mid segment of the right coronary artery with a 3.5 x 12 mm Synergy DES. Mild disease in the left coronary system. Tortuosity was evident. Mild coronary calcification evident.  Echocardiogram 09/13/2019: Left ventricle cavity is normal in size. Mild concentric hypertrophy of the left ventricle. Normal LV systolic function with EF 55%. Normal global wall motion. Normal diastolic filling pattern.  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  Trace aortic stenosis. Moderate (grade II) aortic regurgitation. Mild (Grade I) mitral regurgitation. Aortic root dilatation measuring 4.4 cm at sinuses of Valsalva. IVC is dilated with respiratory variation. Estimated RA pressure 8 mmHg. No significant change compared to previous study in 2018.   CTA chest 09/29/2018:  1. Aneurysmal aortic root  measuring a maximum of 4.7 cm at the sinuses of Valsalva. 2. Thickened and calcified aortic valve. Suspect underlying valvular disease with possible functionally bicuspid valve. 3. Multivessel coronary artery calcifications. 4. Left lower lobe 1.2 cm partially calcified pulmonary nodule. Given the calcification in the presence of old granulomatous disease in the spleen, this is strongly favored to represent a benign granuloma. However, given the absence of additional pulmonary nodules at least 1 follow-up CT scan should be considered in 6-12 months to confirm stability  Assessment     ICD-10-CM   1. Ascending aortic aneurysm (HCC)  I71.2   2. Coronary artery disease involving native coronary artery of native heart without angina pectoris  I25.10   3. Essential hypertension  I10   4. Mild hyperlipidemia  E78.5     EKG 09/23/2019: Normal sinus rhythm at rate of 74 bpm, inferior infarct old, incomplete right bundle branch block.  Poor R-wave progression, cannot exclude anterolateral infarct old. No significant change from   EKG 09/15/2018.  Recommendations:   Patient with known coronary artery disease, old inferior MI, without angina pectoris.  He remains active.  No change in physical exam.  Aortic root dilatation and mild aortic aneurysm has remained stable. He needs repeat echocardiogram in one year.  I will consider obtaining a CT scan however there is been in good correlation between echo report and CTA chest.  Lipids are not well controlled.  LDL still is greater than 70, I have added Zetia 10 mg in the evening, he'll obtain lipid profile testing in 4-6 weeks.  Blood pressure is well controlled, much lower at home than what is here today.  Consent did not make any changes to his medications.  I will see him back in one year.  Continue losartan for hypertension and also for aortic root dilatation.   Jay Ganji, MD, FACC 09/23/2019, 6:00 AM [facility] [place] Pager: [phone]  Office: [phone] If no answer Cell [phone] 

## 2019-11-02 DIAGNOSIS — Z23 Encounter for immunization: Secondary | ICD-10-CM | POA: Diagnosis not present

## 2019-11-26 ENCOUNTER — Other Ambulatory Visit: Payer: Self-pay | Admitting: Cardiology

## 2019-11-26 DIAGNOSIS — G4733 Obstructive sleep apnea (adult) (pediatric): Secondary | ICD-10-CM | POA: Diagnosis not present

## 2020-02-19 ENCOUNTER — Ambulatory Visit: Payer: Self-pay | Attending: Internal Medicine

## 2020-02-19 DIAGNOSIS — Z23 Encounter for immunization: Secondary | ICD-10-CM | POA: Insufficient documentation

## 2020-02-19 NOTE — Progress Notes (Signed)
   Covid-19 Vaccination Clinic  Name:  Dan [name]    MRN: [id number] DOB: [birth year]  02/19/2020  Mr. Swails was observed post Covid-19 immunization for 15 minutes without incident. He was provided with Vaccine Information Sheet and instruction to access the V-Safe system.   Mr. Barley was instructed to call 911 with any severe reactions post vaccine: Marland Kitchen Difficulty breathing  . Swelling of face and throat  . A fast heartbeat  . A bad rash all over body  . Dizziness and weakness   Immunizations Administered    Name Date Dose VIS Date Route   Pfizer COVID-19 Vaccine 02/19/2020  8:44 AM 0.3 mL 11/26/2019 Intramuscular   Manufacturer: [employer], [employer]   Lot: [id number]   NDC: [address]-9

## 2020-03-06 DIAGNOSIS — G4733 Obstructive sleep apnea (adult) (pediatric): Secondary | ICD-10-CM | POA: Diagnosis not present

## 2020-03-11 ENCOUNTER — Ambulatory Visit: Payer: Self-pay | Attending: Internal Medicine

## 2020-03-11 DIAGNOSIS — Z23 Encounter for immunization: Secondary | ICD-10-CM

## 2020-03-11 NOTE — Progress Notes (Signed)
   Covid-19 Vaccination Clinic  Name:  Dan [name]    MRN: [id number] DOB: [birth year]  03/11/2020  [name] was observed post Covid-19 immunization for 15 minutes without incident. He was provided with Vaccine Information Sheet and instruction to access the V-Safe system.   [name] was instructed to call 911 with any severe reactions post vaccine: Marland Kitchen Difficulty breathing  . Swelling of face and throat  . A fast heartbeat  . A bad rash all over body  . Dizziness and weakness   Immunizations Administered    Name Date Dose VIS Date Route   Pfizer COVID-19 Vaccine 03/11/2020  9:37 AM 0.3 mL 11/26/2019 Intramuscular   Manufacturer: [employer], [employer]   Lot: [id number]   NDC: [address]-6

## 2020-07-11 DIAGNOSIS — G4733 Obstructive sleep apnea (adult) (pediatric): Secondary | ICD-10-CM | POA: Diagnosis not present

## 2020-08-28 DIAGNOSIS — H5213 Myopia, bilateral: Secondary | ICD-10-CM | POA: Diagnosis not present

## 2020-08-28 DIAGNOSIS — H2000 Unspecified acute and subacute iridocyclitis: Secondary | ICD-10-CM | POA: Diagnosis not present

## 2020-08-28 DIAGNOSIS — H33301 Unspecified retinal break, right eye: Secondary | ICD-10-CM | POA: Diagnosis not present

## 2020-09-07 DIAGNOSIS — H903 Sensorineural hearing loss, bilateral: Secondary | ICD-10-CM | POA: Diagnosis not present

## 2020-09-12 DIAGNOSIS — H2000 Unspecified acute and subacute iridocyclitis: Secondary | ICD-10-CM | POA: Diagnosis not present

## 2020-09-13 ENCOUNTER — Ambulatory Visit: Payer: BC Managed Care – PPO

## 2020-09-13 ENCOUNTER — Other Ambulatory Visit: Payer: Self-pay

## 2020-09-13 DIAGNOSIS — I712 Thoracic aortic aneurysm, without rupture: Secondary | ICD-10-CM | POA: Diagnosis not present

## 2020-09-13 DIAGNOSIS — Q231 Congenital insufficiency of aortic valve: Secondary | ICD-10-CM

## 2020-09-13 DIAGNOSIS — I2510 Atherosclerotic heart disease of native coronary artery without angina pectoris: Secondary | ICD-10-CM | POA: Diagnosis not present

## 2020-09-13 DIAGNOSIS — I7121 Aneurysm of the ascending aorta, without rupture: Secondary | ICD-10-CM

## 2020-09-14 DIAGNOSIS — I10 Essential (primary) hypertension: Secondary | ICD-10-CM | POA: Diagnosis not present

## 2020-09-14 DIAGNOSIS — E7800 Pure hypercholesterolemia, unspecified: Secondary | ICD-10-CM | POA: Diagnosis not present

## 2020-09-14 DIAGNOSIS — Z0000 Encounter for general adult medical examination without abnormal findings: Secondary | ICD-10-CM | POA: Diagnosis not present

## 2020-09-14 DIAGNOSIS — Z125 Encounter for screening for malignant neoplasm of prostate: Secondary | ICD-10-CM | POA: Diagnosis not present

## 2020-09-14 DIAGNOSIS — Z79899 Other long term (current) drug therapy: Secondary | ICD-10-CM | POA: Diagnosis not present

## 2020-09-25 ENCOUNTER — Ambulatory Visit: Payer: BC Managed Care – PPO | Admitting: Cardiology

## 2020-09-29 ENCOUNTER — Ambulatory Visit: Payer: BC Managed Care – PPO | Admitting: Cardiology

## 2020-09-29 ENCOUNTER — Encounter: Payer: Self-pay | Admitting: Cardiology

## 2020-09-29 ENCOUNTER — Other Ambulatory Visit: Payer: Self-pay

## 2020-09-29 VITALS — BP 122/80 | HR 63 | Resp 16 | Ht 68.0 in | Wt 183.0 lb

## 2020-09-29 DIAGNOSIS — I2510 Atherosclerotic heart disease of native coronary artery without angina pectoris: Secondary | ICD-10-CM | POA: Diagnosis not present

## 2020-09-29 DIAGNOSIS — E7800 Pure hypercholesterolemia, unspecified: Secondary | ICD-10-CM | POA: Diagnosis not present

## 2020-09-29 DIAGNOSIS — I10 Essential (primary) hypertension: Secondary | ICD-10-CM

## 2020-09-29 DIAGNOSIS — Q231 Congenital insufficiency of aortic valve: Secondary | ICD-10-CM

## 2020-09-29 DIAGNOSIS — I7121 Aneurysm of the ascending aorta, without rupture: Secondary | ICD-10-CM

## 2020-09-29 DIAGNOSIS — I712 Thoracic aortic aneurysm, without rupture: Secondary | ICD-10-CM | POA: Diagnosis not present

## 2020-09-29 MED ORDER — NEXLIZET 180-10 MG PO TABS: 1.0000 | ORAL_TABLET | Freq: Every day | ORAL | 3 refills | Status: DC

## 2020-09-29 MED ORDER — BEMPEDOIC ACID 180 MG PO TABS: 1.0000 | ORAL_TABLET | Freq: Every day | ORAL | 0 refills | Status: DC

## 2020-09-29 NOTE — Patient Instructions (Addendum)
Lab work in 4-8 weeks at [facility].  [employer] after you finish current supply of ezetimibe and bempedoic acid.  Echo in Sept. 2022

## 2020-09-29 NOTE — Progress Notes (Signed)
Primary Physician/Referring:  Lawerance Cruel, MD  Patient ID: Dan [name], male    DOB: 1956/09/16, 64 y.o.   MRN: [id number]  Chief Complaint  Patient presents with  . Coronary Artery Disease  . Follow-up    1 year    HPI:    Dan [name]  is a 64 y.o. 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   Patient presents for annual follow-up, he is presently doing well and remains asymptomatic.  He maintains a very healthy diet and exercises on a regular basis.  Has not used sublingual nitroglycerin.  Denies chest pain, shortness of breath, PND, orthopnea, syncope, dizziness, swelling.  Past Medical History:  Diagnosis Date  . GERD (gastroesophageal reflux disease)   . History of hiatal hernia   . Hyperlipidemia   . Hypertension   . Left ACL tear    Past Surgical History:  Procedure Laterality Date  . CARDIAC CATHETERIZATION N/A 05/20/2016   Procedure: Left Heart Cath and Coronary Angiography;  Surgeon: Adrian Prows, MD;  Location: [facility];  Service: Cardiovascular;  Laterality: N/A;  . CARDIAC CATHETERIZATION N/A 05/20/2016   Procedure: Coronary Stent Intervention;  Surgeon: Adrian Prows, MD;  Location: [facility];  Service: Cardiovascular;  Laterality: N/A;  . HAIR TRANSPLANT    . NASAL SINUS SURGERY     Social History   Tobacco Use  . Smoking status: Never Smoker  . Smokeless tobacco: Never Used  Substance Use Topics  . Alcohol use: Yes    Comment: social  Marital Status: Married  ROS  Review of Systems  Constitutional: Negative for malaise/fatigue.  Cardiovascular: Negative for chest pain, claudication,  dyspnea on exertion, leg swelling, orthopnea, palpitations, paroxysmal nocturnal dyspnea and syncope.  Respiratory: Negative for shortness of breath.   Endocrine: Negative for cold intolerance.  Hematologic/Lymphatic: Does not bruise/bleed easily.  Musculoskeletal: Negative for joint swelling.  Gastrointestinal: Negative for hematochezia and melena.  Neurological: Negative for headaches and light-headedness.  Psychiatric/Behavioral: Negative for depression and substance abuse.  All other systems reviewed and are negative.  Objective   Vitals with BMI 09/29/2020 09/23/2019 09/06/2016  Height 5' 8" 5' 8" -  Weight 183 lbs 185 lbs 179 lbs 7 oz  BMI 67.59 16.38 -  Systolic 466 599 -  Diastolic 80 85 -  Pulse 63 80 -    Blood pressure 122/80, pulse 63, resp. rate 16, height 5' 8" (1.727 m), weight 183 lb (83 kg), SpO2 98 %. Body mass index is 27.83 kg/m.   Physical Exam Constitutional:      General: He is not in acute distress.    Appearance: He is well-developed.  Eyes:     Conjunctiva/sclera: Conjunctivae normal.  Neck:     Thyroid: No thyromegaly.     Vascular: No JVD.  Cardiovascular:     Rate and Rhythm: Normal rate and regular rhythm.     Pulses: Intact distal pulses.     Heart sounds: S1 normal and S2 normal. No murmur heard.  No gallop.      Comments: No leg edema, no JVD. Pulmonary:  Effort: Pulmonary effort is normal.     Breath sounds: Normal breath sounds.  Abdominal:     General: Bowel sounds are normal.     Palpations: Abdomen is soft.  Musculoskeletal:        General: Normal range of motion.     Cervical back: Neck supple.  Skin:    General: Skin is warm and dry.  Neurological:     Mental Status: He is alert.     Laboratory examination:   No results for input(s): NA, K, CL, CO2, GLUCOSE, BUN, CREATININE, CALCIUM, GFRNONAA, GFRAA in the last 8760 hours. CMP Latest Ref Rng & Units 05/21/2016 05/21/2016 05/20/2016  Glucose 65 - 99 mg/dL 106(H) 103(H) 117(H)   BUN 6 - 20 mg/dL 15 17 23(H)  Creatinine 0.61 - 1.24 mg/dL 1.25(H) 1.11 1.30(H)  Sodium 135 - 145 mmol/L 139 137 139  Potassium 3.5 - 5.1 mmol/L 4.2 4.0 3.3(L)  Chloride 101 - 111 mmol/L 106 105 98(L)  CO2 22 - 32 mmol/L 25 23 -  Calcium 8.9 - 10.3 mg/dL 8.2(L) 8.2(L) -  Total Protein 6.5 - 8.1 g/dL - - -  Total Bilirubin 0.3 - 1.2 mg/dL - - -  Alkaline Phos 38 - 126 U/L - - -  AST 15 - 41 U/L - - -  ALT 17 - 63 U/L - - -   CBC Latest Ref Rng & Units 05/21/2016 05/20/2016 05/20/2016  WBC 4.0 - 10.5 K/uL 9.0 - 9.7  Hemoglobin 13.0 - 17.0 g/dL 12.4(L) 16.7 16.0  Hematocrit 39 - 52 % 36.8(L) 49.0 45.8  Platelets 150 - 400 K/uL 172 - 206   Lipid Panel     Component Value Date/Time   CHOL 144 05/21/2016 0049   TRIG 13 05/21/2016 0049   HDL 50 05/21/2016 0049   CHOLHDL 2.9 05/21/2016 0049   VLDL 3 05/21/2016 0049   LDLCALC 91 05/21/2016 0049   HEMOGLOBIN A1C No results found for: HGBA1C, MPG TSH No results for input(s): TSH in the last 8760 hours.   External Labs:  09/14/2020: HDL 54, LDL 72, total cholesterol 140, triglycerides 70 BUN 21, creatinine 1.17  Labs 09/01/2019: Serum glucose 80 mg, BUN 23, creatinine 1.20, eGFR 62 mL, potassium 4.6, CMP otherwise normal.  Total cholesterol 161, triglycerides 168, HDL 57, LDL 80.  Non-HDL cholesterol 1.  Labs 07/30/2018: Serum glucose 95 mg, BUN 26, creatinine 1.31, eGFR 56 mL, potassium 4.4, CMP otherwise normal.  46.  Total cholesterol 148, triglycerides 78, HDL 53, LDL 79.  Unstable cholesterol 95.  Medications and allergies  No Known Allergies   Prior to Admission medications   Medication Sig Start Date End Date Taking? Authorizing Provider  aspirin 81 MG chewable tablet Chew 1 tablet (81 mg total) by mouth daily. 05/22/16   Neldon Labella, NP  atorvastatin (LIPITOR) 80 MG tablet Take 1 tablet (80 mg total) by mouth daily at 6 PM. 05/22/16   Neldon Labella, NP  Calcium Carb-Cholecalciferol (CALCIUM 600/VITAMIN D3) 600-800  MG-UNIT TABS Take 1 tablet by mouth daily.    [provider]  carvedilol (COREG) 3.125 MG tablet Take 1 tablet (3.125 mg total) by mouth 2 (two) times daily with a meal. 05/22/16   Neldon Labella, NP  Cholecalciferol (VITAMIN D-3) 1000 units CAPS Take 1,000 Units by mouth daily.    [provider]  cyanocobalamin 500 MCG tablet Take 1,000 mcg by mouth daily.     [provider]  famotidine (PEPCID) 20 MG tablet Take 20  mg by mouth 2 (two) times daily.    [provider]  glucosamine-chondroitin 500-400 MG tablet Take 1 tablet by mouth 3 (three) times daily.    [provider]  losartan (COZAAR) 100 MG tablet Take 100 mg by mouth daily.    [provider]  Multiple Vitamin (MULTIVITAMIN WITH MINERALS) TABS tablet Take 1 tablet by mouth daily.    [provider]  nitroGLYCERIN (NITROSTAT) 0.4 MG SL tablet Place 1 tablet (0.4 mg total) under the tongue every 5 (five) minutes as needed for chest pain. 05/22/16   Neldon Labella, NP  Omega-3 Fatty Acids (FISH OIL) 1000 MG CAPS Take 1,000 mg by mouth daily.     [provider]  ticagrelor (BRILINTA) 90 MG TABS tablet Take 1 tablet (90 mg total) by mouth 2 (two) times daily. 05/22/16   Neldon Labella, NP  vitamin C (ASCORBIC ACID) 500 MG tablet Take 1,000 mg by mouth daily.     [provider]     Current Outpatient Medications  Medication Instructions  . aspirin 81 mg, Oral, Daily  . atorvastatin (LIPITOR) 80 mg, Oral, Daily-1800  . Bempedoic Acid 180 MG TABS 1 tablet, Oral, Daily  . Bempedoic Acid-Ezetimibe (NEXLIZET) 180-10 MG TABS 1 tablet, Oral, Daily  . Calcium Carb-Cholecalciferol (CALCIUM 600/VITAMIN D3) 600-800 MG-UNIT TABS 1 tablet, Oral, Daily  . carvedilol (COREG) 3.125 mg, Oral, 2 times daily with meals  . Multiple Vitamin (MULTIVITAMIN WITH MINERALS) TABS tablet 1 tablet, Oral, Daily  . nitroGLYCERIN (NITROSTAT) 0.4 mg, Sublingual, Every 5 min PRN  .  omeprazole (PRILOSEC) 20 mg, Oral, Daily  . telmisartan (MICARDIS) 20 mg, Oral, Daily  . vitamin B-12 (CYANOCOBALAMIN) 1,000 mcg, Oral, Daily  . vitamin C (ASCORBIC ACID) 1,000 mg, Oral, Daily  . Vitamin D-3 1,000 Units, Oral, Daily    Radiology:  No results found.  CTA chest 09/29/2018:  1. Aneurysmal aortic root measuring a maximum of 4.7 cm at the sinuses of Valsalva. 2. Thickened and calcified aortic valve. Suspect underlying valvular disease with possible functionally bicuspid valve. 3. Multivessel coronary artery calcifications. 4. Left lower lobe 1.2 cm partially calcified pulmonary nodule. Given the calcification in the presence of old granulomatous disease in the spleen, this is strongly favored to represent a benign granuloma. However, given the absence of additional pulmonary nodules at least 1 follow-up CT scan should be considered in 6-12 months to confirm stability  Cardiac Studies:   Coronary Angiogram 05/20/2016: 2 overlapping 3.0 x 20 and 3.0 x 28 mm Synergy DES and stenting of the proximal to mid segment of the right coronary artery with a 3.5 x 12 mm Synergy DES. Mild disease in the left coronary system. Tortuosity was evident. Mild coronary calcification evident.  PCV ECHOCARDIOGRAM COMPLETE 09/13/2020  Narrative Echocardiogram 09/13/2020: Left ventricle cavity is normal in size. Mild concentric hypertrophy of the left ventricle. Normal global wall motion. Normal LV systolic function with EF 60%. Normal diastolic filling pattern. The aortic root is mildly dilated at 3.9 cm. Bicuspid aortic valve.  Trace aortic stenosis. Aortic valve mean gradient of 5 mmHg, Vmax of 1.6 m/s. Calculated aortic valve area by continuity equation is 2 cm. Moderate (grade II) aortic regurgitation. Mild (Grade I) mitral regurgitation. Mild tricuspid regurgitation. No evidence of pulmonary hypertension. No significant change compared to previous study on 09/13/2019.   EKG  EKG 09/29/2020:  Normal sinus rhythm at a rate of 62 bpm, left axis.  Incomplete right bundle branch block.  Poor R wave  progression, cannot exclude anterior infarct old.  Old inferior infarct.  Nonspecific T wave abnormality.  Compared to EKG 09/23/2019, no significant change.  Assessment     ICD-10-CM   1. Coronary artery disease involving native coronary artery of native heart without angina pectoris  I25.10 EKG 12-Lead    Lipid Panel With LDL/HDL Ratio    Bempedoic Acid 180 MG TABS    Bempedoic Acid-Ezetimibe (NEXLIZET) 180-10 MG TABS  2. Hypercholesteremia  E78.00 Lipid Panel With LDL/HDL Ratio    Bempedoic Acid 180 MG TABS    Bempedoic Acid-Ezetimibe (NEXLIZET) 180-10 MG TABS  3. Ascending aortic aneurysm (HCC)  I71.2 PCV ECHOCARDIOGRAM COMPLETE  4. Bicuspid aortic valve  Q23.1   5. Essential hypertension  I10     Recommendations:  Dan [name]  is a 64 y.o. 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   Patient remains stable and asymptomatic without angina pectoris.  He maintains an active and healthy lifestyle, encouraged him to continue this.  Physical exam remains unchanged from previous visit.  Reviewed echocardiogram results, aortic root dilation and mild aortic aneurysm have remained stable and EF of 60%.  Will repeat echocardiogram in 1 year.  In view of previous myocardial infarction will continue aspirin 81 mg daily, as he is tolerating this without bleeding diathesis.  Reviewed external labs 08/18/2020, LDL remains above goal at 72.  He is currently taking atorvastatin 80 mg daily and ezetimibe 10 mg daily.  Discussed with patient that in  view of his known coronary artery disease and previous MI LDL goal for him is less than 70 with greater benefits likely if LDL is less than 55.  Already maintains a very healthy diet, therefore will add Nexletol 180 mg daily and recheck lipid profile in 4 to 8 weeks.  If LDL reaches goal and patient is able to tolerate new medication we will then switch him to Nexlizet, however he wishes to finish current supply of ezetimibe first.  Blood pressure is well controlled, will continue carvedilol, telmisartan. S/L NTG was disccused and explained how to and when to use it and to notify us if there is change in frequency of use. Interaction with cialis-like agents was discussed.  Follow-up in 1 year following echocardiogram for aortic root dilation, CAD, hyperlipidemia.  Patient was seen in collaboration with Dr. Einar Gip. He also reviewed patient's chart and examined the patient. Dr. Einar Gip is in agreement of the plan.    Alethia Berthold, PA-C 09/29/2020, 12:26 PM Office: [phone]

## 2020-10-02 ENCOUNTER — Ambulatory Visit: Payer: BC Managed Care – PPO | Admitting: Cardiology

## 2020-10-24 DIAGNOSIS — H2000 Unspecified acute and subacute iridocyclitis: Secondary | ICD-10-CM | POA: Diagnosis not present

## 2020-10-31 ENCOUNTER — Other Ambulatory Visit: Payer: Self-pay | Admitting: Student

## 2020-10-31 DIAGNOSIS — I2510 Atherosclerotic heart disease of native coronary artery without angina pectoris: Secondary | ICD-10-CM | POA: Diagnosis not present

## 2020-10-31 DIAGNOSIS — H20023 Recurrent acute iridocyclitis, bilateral: Secondary | ICD-10-CM | POA: Diagnosis not present

## 2020-10-31 DIAGNOSIS — E7800 Pure hypercholesterolemia, unspecified: Secondary | ICD-10-CM | POA: Diagnosis not present

## 2020-11-01 LAB — LIPID PANEL WITH LDL/HDL RATIO
Cholesterol, Total: 116 mg/dL (ref 100–199)
HDL: 53 mg/dL (ref >39)
LDL Chol Calc (NIH): 50 mg/dL (ref 0–99)
LDL/HDL Ratio: 0.9 ratio (ref 0.0–3.6)
Triglycerides: 61 mg/dL (ref 0–149)
VLDL Cholesterol Cal: 13 mg/dL (ref 5–40)

## 2020-11-06 ENCOUNTER — Telehealth: Payer: Self-pay

## 2020-11-06 ENCOUNTER — Telehealth: Payer: Self-pay | Admitting: Cardiology

## 2020-11-06 NOTE — Progress Notes (Signed)
I called to inform patient of results and patient was unhappy that labs were resulted by [facility]. Per patient, specific instructions were left for results to only be given from you. Patient states he will be waiting for a call back from you personally.

## 2020-11-06 NOTE — Progress Notes (Signed)
Please inform patient his bad cholesterol is now well controlled. He is to keep taking the nexletol.

## 2020-11-06 NOTE — Telephone Encounter (Signed)
nexlitol approved 10/24/20

## 2020-11-06 NOTE — Telephone Encounter (Signed)
Left message on the phone and also encouraged to sign up for My Chart messaging. JG

## 2020-11-06 NOTE — Telephone Encounter (Signed)
Patient called asking for lipid results and whether he should still be taking nexletol?

## 2020-11-06 NOTE — Telephone Encounter (Signed)
-----   Message from Andrew Au, [place] sent at 11/06/2020 12:05 PM EST ----- I called to inform patient of results and patient was unhappy that labs were resulted by [facility]. Per patient, specific instructions were left for results to only be given from you. Patient states he will be waiting for a call back from you personall y.

## 2020-11-07 ENCOUNTER — Other Ambulatory Visit: Payer: Self-pay | Admitting: Cardiology

## 2020-11-07 DIAGNOSIS — H2000 Unspecified acute and subacute iridocyclitis: Secondary | ICD-10-CM | POA: Diagnosis not present

## 2020-11-07 NOTE — Progress Notes (Addendum)
Dr. Jacinto Halim called patient. NA, LMAM  I discussed with the patient regarding normalization of his lipids, patient previously was on atorvastatin and Zetia, with addition of Nexletol, lipids especially LDL is improved.  Patient is willing to continue the medication, will switch him to atorvastatin plus Nexlizet 180/10 after he finishes 400 tablets of Zetia at hand and continue Nexletol until then.    Dan Decamp, MD, [facility] 11/07/2020, 1:18 PM Office: [phone] Pager: [phone]

## 2020-11-08 ENCOUNTER — Telehealth: Payer: Self-pay

## 2020-11-08 NOTE — Telephone Encounter (Signed)
Pharmacy requests new Rx for Nexlizet 180-10 MG tablet. Patient expecting an Rx for this next month. Can we prescribe this? AD/S

## 2020-11-13 NOTE — Telephone Encounter (Signed)
I believe I already sent this script to the pharm when patient was here for last visit. We can resend if we need to though. Just let me know if I need to do anything.

## 2020-11-15 ENCOUNTER — Other Ambulatory Visit: Payer: Self-pay

## 2020-11-15 DIAGNOSIS — E7800 Pure hypercholesterolemia, unspecified: Secondary | ICD-10-CM

## 2020-11-15 DIAGNOSIS — I2510 Atherosclerotic heart disease of native coronary artery without angina pectoris: Secondary | ICD-10-CM

## 2020-11-15 MED ORDER — NEXLIZET 180-10 MG PO TABS: 1.0000 | ORAL_TABLET | Freq: Every day | ORAL | 3 refills | Status: DC

## 2020-11-15 NOTE — Telephone Encounter (Signed)
Prior authorization needed. Submitting today per Delice Bison.

## 2020-11-16 DIAGNOSIS — G4733 Obstructive sleep apnea (adult) (pediatric): Secondary | ICD-10-CM | POA: Diagnosis not present

## 2020-11-29 ENCOUNTER — Other Ambulatory Visit: Payer: Self-pay

## 2020-11-29 DIAGNOSIS — I2510 Atherosclerotic heart disease of native coronary artery without angina pectoris: Secondary | ICD-10-CM

## 2020-11-29 DIAGNOSIS — E7800 Pure hypercholesterolemia, unspecified: Secondary | ICD-10-CM

## 2020-11-29 MED ORDER — NEXLIZET 180-10 MG PO TABS: 1.0000 | ORAL_TABLET | Freq: Every day | ORAL | 1 refills | Status: DC

## 2020-12-05 DIAGNOSIS — H2000 Unspecified acute and subacute iridocyclitis: Secondary | ICD-10-CM | POA: Diagnosis not present

## 2020-12-08 ENCOUNTER — Other Ambulatory Visit: Payer: Self-pay | Admitting: Cardiology

## 2020-12-19 DIAGNOSIS — S6000XA Contusion of unspecified finger without damage to nail, initial encounter: Secondary | ICD-10-CM | POA: Diagnosis not present

## 2020-12-19 DIAGNOSIS — S52509D Unspecified fracture of the lower end of unspecified radius, subsequent encounter for closed fracture with routine healing: Secondary | ICD-10-CM | POA: Diagnosis not present

## 2020-12-21 DIAGNOSIS — H2000 Unspecified acute and subacute iridocyclitis: Secondary | ICD-10-CM | POA: Diagnosis not present

## 2021-02-01 DIAGNOSIS — H2000 Unspecified acute and subacute iridocyclitis: Secondary | ICD-10-CM | POA: Diagnosis not present

## 2021-02-16 ENCOUNTER — Other Ambulatory Visit: Payer: Self-pay

## 2021-02-16 MED ORDER — NITROGLYCERIN 0.4 MG SL SUBL: 0.4000 mg | SUBLINGUAL_TABLET | SUBLINGUAL | 12 refills | Status: DC | PRN

## 2021-02-27 DIAGNOSIS — H2000 Unspecified acute and subacute iridocyclitis: Secondary | ICD-10-CM | POA: Diagnosis not present

## 2021-02-27 DIAGNOSIS — H1131 Conjunctival hemorrhage, right eye: Secondary | ICD-10-CM | POA: Diagnosis not present

## 2021-03-06 ENCOUNTER — Telehealth: Payer: Self-pay

## 2021-03-06 DIAGNOSIS — G4733 Obstructive sleep apnea (adult) (pediatric): Secondary | ICD-10-CM | POA: Diagnosis not present

## 2021-03-06 NOTE — Telephone Encounter (Signed)
Pt called and stated that the cost of his atorvastatin has gone way up and he does not want to talk to anyone but you about the issue.

## 2021-03-08 NOTE — Telephone Encounter (Signed)
Called pharmacy and pt regarding cost of nexlizet. Pt does not want to speak to insurance company to fix the cost of his medication.

## 2021-03-08 NOTE — Telephone Encounter (Signed)
I have personally discussed with the patient, advised him to write a letter to the pharmacy/insurance company requesting them to change his tier to tier 2. Please leave 2 to 3 weeks worth of samples and the front desk with his name, he will come and pick them up.

## 2021-05-01 DIAGNOSIS — H2013 Chronic iridocyclitis, bilateral: Secondary | ICD-10-CM | POA: Diagnosis not present

## 2021-07-17 DIAGNOSIS — H2013 Chronic iridocyclitis, bilateral: Secondary | ICD-10-CM | POA: Diagnosis not present

## 2021-07-20 DIAGNOSIS — G4733 Obstructive sleep apnea (adult) (pediatric): Secondary | ICD-10-CM | POA: Diagnosis not present

## 2021-08-14 DIAGNOSIS — H2013 Chronic iridocyclitis, bilateral: Secondary | ICD-10-CM | POA: Diagnosis not present

## 2021-08-30 ENCOUNTER — Ambulatory Visit: Payer: BC Managed Care – PPO

## 2021-08-30 ENCOUNTER — Other Ambulatory Visit: Payer: Self-pay

## 2021-08-30 DIAGNOSIS — I7121 Aneurysm of the ascending aorta, without rupture: Secondary | ICD-10-CM

## 2021-08-30 DIAGNOSIS — I712 Thoracic aortic aneurysm, without rupture: Secondary | ICD-10-CM

## 2021-09-04 NOTE — Progress Notes (Signed)
Echo stable, will discuss at upcoming OV.

## 2021-09-20 DIAGNOSIS — Z0000 Encounter for general adult medical examination without abnormal findings: Secondary | ICD-10-CM | POA: Diagnosis not present

## 2021-09-20 NOTE — Progress Notes (Signed)
Labs 09/20/2021:  Serum glucose 87 mg, BUN 24, creatinine 1.30, EGFR 62 mL, potassium 4.3.  CMP otherwise normal.  Total cholesterol 119, triglycerides 86, HDL 48, LDL 54.  Non-HDL cholesterol 71.

## 2021-09-25 DIAGNOSIS — H2013 Chronic iridocyclitis, bilateral: Secondary | ICD-10-CM | POA: Diagnosis not present

## 2021-09-27 ENCOUNTER — Ambulatory Visit: Payer: BC Managed Care – PPO | Admitting: Cardiology

## 2021-10-04 DIAGNOSIS — Z0000 Encounter for general adult medical examination without abnormal findings: Secondary | ICD-10-CM | POA: Diagnosis not present

## 2021-10-04 DIAGNOSIS — E7800 Pure hypercholesterolemia, unspecified: Secondary | ICD-10-CM | POA: Diagnosis not present

## 2021-10-04 DIAGNOSIS — I10 Essential (primary) hypertension: Secondary | ICD-10-CM | POA: Diagnosis not present

## 2021-10-18 DIAGNOSIS — H903 Sensorineural hearing loss, bilateral: Secondary | ICD-10-CM | POA: Diagnosis not present

## 2021-10-24 ENCOUNTER — Ambulatory Visit: Payer: BC Managed Care – PPO | Admitting: Cardiology

## 2021-10-24 ENCOUNTER — Encounter: Payer: Self-pay | Admitting: Cardiology

## 2021-10-24 ENCOUNTER — Other Ambulatory Visit: Payer: Self-pay

## 2021-10-24 VITALS — BP 144/88 | HR 78 | Temp 98.0°F | Ht 68.0 in | Wt 181.0 lb

## 2021-10-24 DIAGNOSIS — I10 Essential (primary) hypertension: Secondary | ICD-10-CM | POA: Diagnosis not present

## 2021-10-24 DIAGNOSIS — I7121 Aneurysm of the ascending aorta, without rupture: Secondary | ICD-10-CM

## 2021-10-24 DIAGNOSIS — I2510 Atherosclerotic heart disease of native coronary artery without angina pectoris: Secondary | ICD-10-CM

## 2021-10-24 DIAGNOSIS — Q231 Congenital insufficiency of aortic valve: Secondary | ICD-10-CM

## 2021-10-24 DIAGNOSIS — I77810 Thoracic aortic ectasia: Secondary | ICD-10-CM | POA: Insufficient documentation

## 2021-10-24 DIAGNOSIS — E7800 Pure hypercholesterolemia, unspecified: Secondary | ICD-10-CM | POA: Insufficient documentation

## 2021-10-24 NOTE — Progress Notes (Signed)
Primary Physician/Referring:  Lawerance Cruel, MD  Patient ID: Dan [name], male    DOB: 07-May-1956, 65 y.o.   MRN: [id number]  Chief Complaint  Patient presents with   Coronary Artery Disease   Follow-up   HPI:    Dan [name]  is a 65 y.o. Caucasian male with hypertension and hyperlipidemia, Inferior MI on 05/20/2016, history of right coronary artery stenting, aortic root dilatation presents here for follow-up.  Patient presents for annual follow-up, he is presently doing well and remains asymptomatic.  He maintains a very healthy diet and exercises on a regular basis.  Has not used sublingual nitroglycerin.  Denies chest pain, shortness of breath, PND, orthopnea, syncope, dizziness, swelling.  Past Medical History:  Diagnosis Date   GERD (gastroesophageal reflux disease)    History of hiatal hernia    Hyperlipidemia    Hypertension    Left ACL tear    Past Surgical History:  Procedure Laterality Date   CARDIAC CATHETERIZATION N/A 05/20/2016   Procedure: Left Heart Cath and Coronary Angiography;  Surgeon: Adrian Prows, MD;  Location: [place] [facility];  Service: Cardiovascular;  Laterality: N/A;   CARDIAC CATHETERIZATION N/A 05/20/2016   Procedure: Coronary Stent Intervention;  Surgeon: Adrian Prows, MD;  Location: [facility];  Service: Cardiovascular;  Laterality: N/A;   HAIR TRANSPLANT     NASAL SINUS SURGERY    History reviewed. No pertinent family history.  Social History   Tobacco Use   Smoking status: Never   Smokeless tobacco: Never  Substance Use Topics   Alcohol use: Yes    Comment: social  Marital Status: Married  ROS  Review of Systems  Cardiovascular:  Negative for chest pain, dyspnea on exertion and leg swelling.  Gastrointestinal:  Negative for melena.  Objective   Vitals with BMI 10/24/2021 09/29/2020 09/23/2019  Height '5\' 8"'  '5\' 8"'  '5\' 8"'   Weight 181 lbs 183 lbs 185 lbs  BMI 27.53 00.92 33.00  Systolic 762 263 335  Diastolic 88 80 85  Pulse 78 63 80     Blood pressure (!) 144/88, pulse 78, temperature 98 F (36.7 C), height '5\' 8"'  (1.727 m), weight 181 lb (82.1 kg). Body mass index is 27.52 kg/m.   Physical Exam Neck:     Vascular: No carotid bruit or JVD.  Cardiovascular:     Rate and Rhythm: Normal rate and regular rhythm.     Pulses: Intact distal pulses.     Heart sounds: Normal heart sounds. No murmur heard.   No gallop.  Pulmonary:     Effort: Pulmonary effort is normal.     Breath sounds: Normal breath sounds.  Abdominal:     General: Bowel sounds are normal.     Palpations: Abdomen is soft.  Musculoskeletal:        General: No swelling.    Laboratory examination:   No results for input(s): NA, K, CL, CO2, GLUCOSE, BUN, CREATININE, CALCIUM, GFRNONAA, GFRAA in the last 8760 hours. CMP Latest Ref Rng & Units 05/21/2016 05/21/2016 05/20/2016  Glucose 65 - 99 mg/dL 106(H) 103(H) 117(H)  BUN 6 - 20 mg/dL 15 17 23(H)  Creatinine 0.61 - 1.24 mg/dL 1.25(H) 1.11 1.30(H)  Sodium 135 - 145 mmol/L 139 137 139  Potassium 3.5 - 5.1 mmol/L 4.2 4.0 3.3(L)  Chloride 101 - 111 mmol/L 106 105 98(L)  CO2 22 - 32 mmol/L 25 23 -  Calcium 8.9 - 10.3 mg/dL 8.2(L) 8.2(L) -  Total Protein 6.5 - 8.1  g/dL - - -  Total Bilirubin 0.3 - 1.2 mg/dL - - -  Alkaline Phos 38 - 126 U/L - - -  AST 15 - 41 U/L - - -  ALT 17 - 63 U/L - - -   CBC Latest Ref Rng & Units 05/21/2016 05/20/2016 05/20/2016  WBC 4.0 - 10.5 K/uL 9.0 - 9.7  Hemoglobin 13.0 - 17.0 g/dL 12.4(L) 16.7 16.0  Hematocrit 39.0 - 52.0 % 36.8(L) 49.0 45.8  Platelets 150 - 400 K/uL 172 - 206   Lipid Panel     Component Value Date/Time   CHOL 116 10/31/2020 0951   TRIG 61 10/31/2020 0951   HDL 53 10/31/2020 0951   CHOLHDL 2.9 05/21/2016 0049   VLDL 3 05/21/2016 0049   LDLCALC 50 10/31/2020 0951   External Labs:   Labs 09/20/2021:  Serum glucose 87 mg, BUN 24, creatinine 1.30, EGFR 62 mL, potassium 4.3.  Total cholesterol 119, triglycerides 86, HDL 48, LDL 54, non-HDL cholesterol  71.  Vitamin D3 50.  Allergies  No Known Allergies   Medications Prior to Visit:   Outpatient Medications Prior to Visit  Medication Sig Dispense Refill   aspirin 81 MG chewable tablet Chew 1 tablet (81 mg total) by mouth daily. 30 tablet 0   atorvastatin (LIPITOR) 80 MG tablet Take 1 tablet (80 mg total) by mouth daily at 6 PM. 30 tablet 1   Bempedoic Acid-Ezetimibe (NEXLIZET) 180-10 MG TABS Take 1 tablet by mouth daily. 90 tablet 1   Calcium Carb-Cholecalciferol 600-800 MG-UNIT TABS Take 1 tablet by mouth daily.     carvedilol (COREG) 6.25 MG tablet Take 6.25 mg by mouth 2 (two) times daily.     Cholecalciferol (VITAMIN D-3) 1000 units CAPS Take 1,000 Units by mouth daily.     cyanocobalamin 500 MCG tablet Take 1,000 mcg by mouth daily.      Multiple Vitamin (MULTIVITAMIN WITH MINERALS) TABS tablet Take 1 tablet by mouth daily.     nitroGLYCERIN (NITROSTAT) 0.4 MG SL tablet Place 1 tablet (0.4 mg total) under the tongue every 5 (five) minutes as needed for chest pain. 25 tablet 12   omeprazole (PRILOSEC) 20 MG capsule Take 20 mg by mouth daily.     telmisartan (MICARDIS) 20 MG tablet Take 20 mg by mouth daily.     vitamin C (ASCORBIC ACID) 500 MG tablet Take 1,000 mg by mouth daily.      carvedilol (COREG) 3.125 MG tablet Take 1 tablet (3.125 mg total) by mouth 2 (two) times daily with a meal. 60 tablet 1   No facility-administered medications prior to visit.   Final Medications at End of Visit    Current Meds  Medication Sig   aspirin 81 MG chewable tablet Chew 1 tablet (81 mg total) by mouth daily.   atorvastatin (LIPITOR) 80 MG tablet Take 1 tablet (80 mg total) by mouth daily at 6 PM.   Bempedoic Acid-Ezetimibe (NEXLIZET) 180-10 MG TABS Take 1 tablet by mouth daily.   Calcium Carb-Cholecalciferol 600-800 MG-UNIT TABS Take 1 tablet by mouth daily.   carvedilol (COREG) 6.25 MG tablet Take 6.25 mg by mouth 2 (two) times daily.   Cholecalciferol (VITAMIN D-3) 1000 units CAPS  Take 1,000 Units by mouth daily.   cyanocobalamin 500 MCG tablet Take 1,000 mcg by mouth daily.    Multiple Vitamin (MULTIVITAMIN WITH MINERALS) TABS tablet Take 1 tablet by mouth daily.   nitroGLYCERIN (NITROSTAT) 0.4 MG SL tablet Place 1 tablet (0.4 mg total)  under the tongue every 5 (five) minutes as needed for chest pain.   omeprazole (PRILOSEC) 20 MG capsule Take 20 mg by mouth daily.   telmisartan (MICARDIS) 20 MG tablet Take 20 mg by mouth daily.   vitamin C (ASCORBIC ACID) 500 MG tablet Take 1,000 mg by mouth daily.    Radiology:  No results found.  CTA chest 09/29/2018:  1. Aneurysmal aortic root measuring a maximum of 4.7 cm at the sinuses of Valsalva. 2. Thickened and calcified aortic valve. Suspect underlying valvular disease with possible functionally bicuspid valve. 3. Multivessel coronary artery calcifications. 4. Left lower lobe 1.2 cm partially calcified pulmonary nodule. Given the calcification in the presence of old granulomatous disease in the spleen, this is strongly favored to represent a benign granuloma. However, given the absence of additional pulmonary nodules at least 1 follow-up CT scan should be considered in 6-12 months to confirm stability  Cardiac Studies:   Coronary Angiogram 05/20/2016: 2 overlapping 3.0 x 20 and 3.0 x 28 mm Synergy DES and stenting of the proximal to mid segment of the right coronary artery with a 3.5 x 12 mm Synergy DES. Mild disease in the left coronary system. Tortuosity was evident. Mild coronary calcification evident.  Echocardiogram 07/30/2021:  Normal LV systolic function with EF 56%. Left ventricle cavity is normal  in size. Moderate concentric hypertrophy of the left ventricle. Normal  global wall motion. DIastolic function not evaluated.  Calculated EF 56%.  Left atrial cavity is mildly dilated at 4.4 cm.  Bicuspid aortic valve. No evidence of aortic stenosis. Mild (Grade I)  aortic regurgitation. Mild aortic valve leaflet  calcification.  Mild tricuspid regurgitation. No evidence of pulmonary hypertension.  Structurally normal pulmonic valve.  Mild pulmonic regurgitation.  The aortic root is dilated. Mildly dilated ascending aorta at 4.1 cm.  IVC is dilated with respiratory variation.  No significant change from 09/13/2020.  EKG   EKG 10/24/2021: Normal sinus rhythm at rate of 63 bpm, inferior infarct old, lateral infarct old, nonspecific T abnormality.  No significant change from 09/29/2020.  Assessment     ICD-10-CM   1. Coronary artery disease involving native coronary artery of native heart without angina pectoris  I25.10 EKG 12-Lead    2. Essential hypertension  I10     3. Hypercholesteremia  E78.00     4. Bicuspid aortic valve  Q23.1 PCV ECHOCARDIOGRAM COMPLETE    CT ANGIO CHEST AORTA W/CM & OR WO/CM    5. Aortic root dilatation (HCC)  I77.810 PCV ECHOCARDIOGRAM COMPLETE    6. Aneurysm of ascending aorta without rupture  I71.21 CT ANGIO CHEST AORTA W/CM & OR WO/CM      Recommendations:   Jhovanny Guinta  is a 65 y.o. Caucasian male with hypertension and hyperlipidemia, Inferior MI on 05/20/2016, history of right coronary artery stenting, aortic root dilatation presents here for follow-up.  Patient remains stable and asymptomatic without angina pectoris.  He maintains an active and healthy lifestyle, encouraged him to continue this.  Physical exam remains unchanged from previous visit.  Reviewed echocardiogram results, aortic root dilation has  remained stable and EF of 60%.  Will repeat echocardiogram in 1 year to f/u on bicuspid AV and aortic root dilatation. I will set him up for CT angiogram of the aorta as in 2019 the sinus of Valsalva measured 4.7 cm, aortic root size on the echocardiogram is at 4.1 cm.  Reviewed external labs 08/18/2020, LDL remains at goal.  He is currently taking atorvastatin 80  mg daily and  Nexlezet 180/10 mg daily.    Blood pressure is well controlled.  Follow-up in 1 year  following echocardiogram for aortic root dilation, CAD, hyperlipidemia.   Adrian Prows, MD, [facility] 10/24/2021, 4:13 PM Office: [phone] Fax: [phone] Pager: [phone]

## 2021-11-01 DIAGNOSIS — H2013 Chronic iridocyclitis, bilateral: Secondary | ICD-10-CM | POA: Diagnosis not present

## 2021-11-07 NOTE — Progress Notes (Signed)
 Office Visit Note  Patient: Dan [name]             Date of Birth: 04/11/1956           MRN: [id number]             PCP: Ross, Charles Alan, MD Referring: Bowen, Bradley, MD Visit Date: 11/15/2021 Occupation: @GUAROCC@  Subjective:  No chief complaint on file.   History of Present Illness: Brandyn Ludwig is a 65 y.o. male seen in consultation per request of his ophthalmologist.  According to the patient about 30 years ago he started having inflammation in his eyes.  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  He was treated with topical eyedrops for many years.  He states in eventually the symptoms resolved and he was symptom-free for about 15 years.  He states the symptoms recurred about 1-1/2 years ago.  He has been having recurrent problems of uveitis in the last 1-1/2 years.  He has been under care of Dr. Bowen.  He states he has been treated with prednisone eyedrops and wetting solution.  He had last episode of uveitis about 4 months ago.  He has been on tapering dose of prednisone since then and using prednisone eyedrops only once a day currently.  He denies any history of joint pain or joint swelling.  He does workout 3 times a week.  And he does aerobic workout and lifts weights.  He notices some stiffness in his hands and knee joints rarely after that but no joint discomfort or swelling.  There is no history of cervical, thoracic or lumbar spine discomfort.  There is no history of morning stiffness.  There is no family history of autoimmune disease.  There is no personal or family history of psoriasis.  No family history of ankylosing spondylitis.     Activities of Daily Living:  Patient reports morning stiffness for 0 minutes.   Patient Denies nocturnal pain.  Difficulty dressing/grooming: Denies Difficulty climbing stairs: Denies Difficulty getting out of chair: Denies Difficulty using hands for taps, buttons, cutlery, and/or writing:  Denies  Review of Systems  Constitutional:  Negative for fatigue.  HENT:  Negative for mouth sores, mouth dryness and nose dryness.   Eyes:  Positive for dryness. Negative for pain, redness and itching.  Respiratory:  Negative for shortness of breath and difficulty breathing.   Cardiovascular:  Negative for chest pain and palpitations.  Gastrointestinal:  Negative for blood in stool, constipation and diarrhea.  Endocrine: Negative for increased urination.  Genitourinary:  Negative for difficulty urinating.  Musculoskeletal:  Negative for joint pain, joint pain, joint swelling, myalgias, morning stiffness, muscle tenderness and myalgias.  Skin:  Negative for color change, rash, redness and sensitivity to sunlight.  Allergic/Immunologic: Negative for susceptible to infections.  Neurological:  Negative for dizziness, numbness, headaches, memory loss and weakness.  Hematological:  Negative for bruising/bleeding tendency.  Psychiatric/Behavioral:  Negative for confusion.    PMFS History:  Patient Active Problem List   Diagnosis Date Noted   Aortic root dilatation (HCC) 10/24/2021   Bicuspid aortic valve 10/24/2021   Hypercholesteremia 10/24/2021   Coronary artery disease involving native coronary artery of native heart without angina pectoris 10/24/2021   STEMI (ST elevation myocardial infarction) (HCC) 05/20/2016   Dyslipidemia 05/20/2016   Essential hypertension 05/20/2016   Acute MI, inferolateral wall, initial episode of care (HCC) 05/20/2016   Fatigue 11/14/2015   Hypersomnia 11/14/2015      Past Medical History:  Diagnosis Date   GERD (gastroesophageal reflux disease)    History of hiatal hernia    Hyperlipidemia    Hypertension    Left ACL tear     Family History  Problem Relation Age of Onset   Healthy Son    Healthy Son    IgA nephropathy Son    Past Surgical History:  Procedure Laterality Date   CARDIAC CATHETERIZATION N/A 05/20/2016   Procedure: Left Heart Cath and  Coronary Angiography;  Surgeon: Jay Ganji, MD;  Location: [facility];  Service: Cardiovascular;  Laterality: N/A;   CARDIAC CATHETERIZATION N/A 05/20/2016   Procedure: Coronary Stent Intervention;  Surgeon: Jay Ganji, MD;  Location: [facility];  Service: Cardiovascular;  Laterality: N/A;   HAIR TRANSPLANT     NASAL SINUS SURGERY     Social History   Social History Narrative   Not on file   Immunization History  Administered Date(s) Administered   PFIZER(Purple Top)SARS-COV-2 Vaccination 02/19/2020, 03/11/2020     Objective: Vital Signs: BP 109/76 (BP Location: Right Arm, Patient Position: Sitting, Cuff Size: Large)   Pulse 66   Ht 5' 8.5" (1.74 m)   Wt 183 lb 3.2 oz (83.1 kg)   BMI 27.45 kg/m    Physical Exam Vitals and nursing note reviewed.  Constitutional:      Appearance: He is well-developed.  HENT:     Head: Normocephalic and atraumatic.  Eyes:     Conjunctiva/sclera: Conjunctivae normal.     Pupils: Pupils are equal, round, and reactive to light.  Cardiovascular:     Rate and Rhythm: Normal rate and regular rhythm.     Heart sounds: Normal heart sounds.  Pulmonary:     Effort: Pulmonary effort is normal.     Breath sounds: Normal breath sounds.  Abdominal:     General: Bowel sounds are normal.     Palpations: Abdomen is soft.  Musculoskeletal:     Cervical back: Normal range of motion and neck supple.  Skin:    General: Skin is warm and dry.     Capillary Refill: Capillary refill takes less than 2 seconds.  Neurological:     Mental Status: He is alert and oriented to person, place, and time.  Psychiatric:        Behavior: Behavior normal.     Musculoskeletal Exam: C-spine, thoracic and lumbar spine were in good range of motion.  He had no SI joint tenderness.  Shoulder joints, elbow joints, wrist joints, MCPs PIPs and DIPs with good range of motion.  He has some PIP and DIP thickening consistent with osteoarthritis.  Hip joints and knee joints  in good range of motion.  There was no tenderness over ankles or MTPs.  There was no evidence of Achilles tendinitis or plantar fasciitis.  CDAI Exam: CDAI Score: -- Patient Global: --; Provider Global: -- Swollen: --; Tender: -- Joint Exam 11/15/2021   No joint exam has been documented for this visit   There is currently no information documented on the homunculus. Go to the Rheumatology activity and complete the homunculus joint exam.  Investigation: No additional findings.  Imaging: No results found.  Recent Labs: Lab Results  Component Value Date   WBC 9.0 05/21/2016   HGB 12.4 (L) 05/21/2016   PLT 172 05/21/2016   NA 139 05/21/2016   K 4.2 05/21/2016   CL 106 05/21/2016   CO2 25 05/21/2016   GLUCOSE 106 (H) 05/21/2016     BUN 15 05/21/2016   CREATININE 1.25 (H) 05/21/2016   BILITOT 0.8 05/20/2016   ALKPHOS 53 05/20/2016   AST 34 05/20/2016   ALT 34 05/20/2016   PROT 7.6 05/20/2016   ALBUMIN 4.4 05/20/2016   CALCIUM 8.2 (L) 05/21/2016   GFRAA >60 05/21/2016    10/31/20: ANA negative, ACE WNL, lyme titers negative, ESR 4, CRP <1,  CBC and CMP WNL, HLA-B27 negative, TB gold negative, RPR negative  Speciality Comments: No specialty comments available.  Procedures:  No procedures performed Allergies: Patient has no known allergies.   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  He was symptom-free for 15 years and then the symptoms restarted 2-1/2 years ago.. Dr. Valetta Close. L eye Sept 2021.Bilateral-10/24/20.  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  Tx: topical steriods.HLA-B27- -he had extensive work-up by Dr. Valetta Close last year which was all negative.  I will obtain some additional labs today.  He has no other clinical features of autoimmune disease on the examination.  There is no history of psoriasis, Achilles tendinitis, plantar fasciitis, sacroiliitis or morning stiffness.  There is no history of  inflammatory arthritis.  There is no history of IBD.  There is no family history of autoimmune disease.  Plan: CMV abs, IgG+IgM (cytomegalovirus), Epstein-Barr virus VCA antibody panel, Hepatitis B core antibody, IgM, Hepatitis C antibody, HSV(herpes simplex vrs) 1+2 ab-IgG, Protein electrophoresis, serum, Rheumatoid factor, Toxoplasma gondii antibody, IgM, Varicella Zoster IgG and IgM, Cyclic citrul peptide antibody, IgG.  We will discuss results when they are available.  I had a detailed discussion with the patient.  If Dr. Valetta Close recommends immunosuppressive therapy I will be happy to work with him on starting the medication.  Primary osteoarthritis of both hands-he has some PIP and DIP thickening but not symptomatic.  Essential hypertension-his blood pressure is normal.  Coronary artery disease involving native coronary artery of native heart without angina pectoris-he is followed by Dr. Einar Gip.  ST elevation myocardial infarction involving right coronary artery [facility]) - 2017  Bicuspid aortic valve  Aortic root dilatation (HCC)  Hypercholesteremia  History of gastroesophageal reflux (GERD)  Orders: Orders Placed This Encounter  Procedures   CMV abs, IgG+IgM (cytomegalovirus)   Epstein-Barr virus VCA antibody panel   Hepatitis B core antibody, IgM   Hepatitis C antibody   HSV(herpes simplex vrs) 1+2 ab-IgG   Protein electrophoresis, serum   Rheumatoid factor   Toxoplasma gondii antibody, IgM   Varicella Zoster IgG and IgM   Cyclic citrul peptide antibody, IgG    No orders of the defined types were placed in this encounter.    Follow-Up Instructions: Return for Uveitis.   Bo Merino, MD  Note - This record has been created using Editor, commissioning.  Chart creation errors have been sought, but may not always  have been located. Such creation errors do not reflect on  the standard of medical care.

## 2021-11-15 ENCOUNTER — Telehealth: Payer: Self-pay

## 2021-11-15 ENCOUNTER — Other Ambulatory Visit: Payer: Self-pay

## 2021-11-15 ENCOUNTER — Encounter: Payer: Self-pay | Admitting: Rheumatology

## 2021-11-15 ENCOUNTER — Ambulatory Visit: Payer: BC Managed Care – PPO | Admitting: Rheumatology

## 2021-11-15 VITALS — BP 109/76 | HR 66 | Ht 68.5 in | Wt 183.2 lb

## 2021-11-15 DIAGNOSIS — I2111 ST elevation (STEMI) myocardial infarction involving right coronary artery: Secondary | ICD-10-CM | POA: Diagnosis not present

## 2021-11-15 DIAGNOSIS — Q2381 Bicuspid aortic valve: Secondary | ICD-10-CM

## 2021-11-15 DIAGNOSIS — E7800 Pure hypercholesterolemia, unspecified: Secondary | ICD-10-CM

## 2021-11-15 DIAGNOSIS — I2510 Atherosclerotic heart disease of native coronary artery without angina pectoris: Secondary | ICD-10-CM

## 2021-11-15 DIAGNOSIS — Z8719 Personal history of other diseases of the digestive system: Secondary | ICD-10-CM

## 2021-11-15 DIAGNOSIS — I10 Essential (primary) hypertension: Secondary | ICD-10-CM | POA: Diagnosis not present

## 2021-11-15 DIAGNOSIS — H209 Unspecified iridocyclitis: Secondary | ICD-10-CM | POA: Diagnosis not present

## 2021-11-15 DIAGNOSIS — R5383 Other fatigue: Secondary | ICD-10-CM

## 2021-11-15 DIAGNOSIS — M19041 Primary osteoarthritis, right hand: Secondary | ICD-10-CM

## 2021-11-15 DIAGNOSIS — I77810 Thoracic aortic ectasia: Secondary | ICD-10-CM

## 2021-11-15 DIAGNOSIS — G4710 Hypersomnia, unspecified: Secondary | ICD-10-CM

## 2021-11-15 DIAGNOSIS — M19042 Primary osteoarthritis, left hand: Secondary | ICD-10-CM

## 2021-11-15 DIAGNOSIS — Q231 Congenital insufficiency of aortic valve: Secondary | ICD-10-CM

## 2021-11-15 NOTE — Addendum Note (Signed)
Addended by: Ellen Henri on: 11/15/2021 09:16 AM   Modules accepted: Orders

## 2021-11-15 NOTE — Telephone Encounter (Signed)
FYI:  Patient called stating he had an appointment with Dr. Corliss Skains this morning and was told he may not need to schedule a follow-up appointment.  Patient was not available any day/time that we offered for a follow-up appointment.  Patient states he will wait for the labwork results before scheduling.

## 2021-11-16 ENCOUNTER — Telehealth: Payer: Self-pay

## 2021-11-16 ENCOUNTER — Ambulatory Visit
Admission: RE | Admit: 2021-11-16 | Discharge: 2021-11-16 | Disposition: A | Discharge status: Home / Self Care | Payer: BC Managed Care – PPO | Source: Ambulatory Visit | Attending: Cardiology | Admitting: Cardiology

## 2021-11-16 DIAGNOSIS — N2889 Other specified disorders of kidney and ureter: Secondary | ICD-10-CM

## 2021-11-16 DIAGNOSIS — Q231 Congenital insufficiency of aortic valve: Secondary | ICD-10-CM

## 2021-11-16 DIAGNOSIS — I700 Atherosclerosis of aorta: Secondary | ICD-10-CM | POA: Diagnosis not present

## 2021-11-16 DIAGNOSIS — I7121 Aneurysm of the ascending aorta, without rupture: Secondary | ICD-10-CM

## 2021-11-16 DIAGNOSIS — I712 Thoracic aortic aneurysm, without rupture, unspecified: Secondary | ICD-10-CM | POA: Diagnosis not present

## 2021-11-16 MED ORDER — IOPAMIDOL (ISOVUE-370) INJECTION 76%
75.0000 mL | Freq: Once | INTRAVENOUS | Status: AC | PRN
  Administered 2021-11-16: 75 mL via INTRAVENOUS

## 2021-11-16 NOTE — Telephone Encounter (Signed)
CT ANGIO CHEST result:  Aortic root is stable, but there is a 15 mm indeterminate left kidney lesion. Recommend abdominal MRI W/WO contrast. Full report on the way.

## 2021-11-16 NOTE — Progress Notes (Signed)
CT angiogram of the chest with contrast 11/16/2021: Stable aneurysmal dilatation of the aortic root at the level of the sinus of Valsalva measuring 45 mm, previously 47 mm. See above comment. Remainder of the thoracic aorta is normal in caliber. Native coronary atherosclerosis Evidence of remote granulomatous disease as above. No other acute intrathoracic finding. 15 mm indeterminate left kidney midpole intermediate density lesion. Difficult to exclude small solid renal mass versus complex hemorrhagic or debris-filled cyst. Recommend nonemergent evaluation with abdominal MRI without and with contrast. Aortic Atherosclerosis (ICD10-I70.0).  Patient aware

## 2021-11-16 NOTE — Telephone Encounter (Signed)
I have discussed the findings of the CT angiogram of the chest revealing stable ascending aortic aneurysm however there is a new finding of a 15 mm left renal mass and recommend MRI with and without contrast as per radiology who had called our office also to remind me of abnormal findings.  I discussed with Durwin regarding the angiography and recommended that we proceed with MRI, orders placed.    ICD-10-CM   1. Left renal mass  N28.89 MR ABDOMEN MRCP W WO CONTAST     CT angiogram of the chest with contrast 11/16/2021: Stable aneurysmal dilatation of the aortic root at the level of the sinus of Valsalva measuring 45 mm, previously 47 mm. See above comment. Remainder of the thoracic aorta is normal in caliber. Native coronary atherosclerosis Evidence of remote granulomatous disease as above. No other acute intrathoracic finding. 15 mm indeterminate left kidney midpole intermediate density lesion. Difficult to exclude small solid renal mass versus complex hemorrhagic or debris-filled cyst. Recommend nonemergent evaluation with abdominal MRI without and with contrast. Aortic Atherosclerosis (ICD10-I70.0).  Orders Placed This Encounter  Procedures   MR ABDOMEN MRCP W WO CONTAST    F/U abnormal CTA chest    Standing Status:   Future    Standing Expiration Date:   01/17/2022    Order Specific Question:   If indicated for the ordered procedure, I authorize the administration of contrast media per Radiology protocol    Answer:   Yes    Order Specific Question:   What is the patient's sedation requirement?    Answer:   No Sedation    Order Specific Question:   Does the patient have a pacemaker or implanted devices?    Answer:   No    Order Specific Question:   Release to patient    Answer:   Immediate    Order Specific Question:   Preferred imaging location?    Answer:   [facility] (table limit-550lbs)     Yates Decamp, MD, [facility] 11/16/2021, 10:21 AM Office: [phone] Fax:  [phone] Pager: [phone]

## 2021-11-19 LAB — PROTEIN ELECTROPHORESIS, SERUM
Albumin ELP: 4.4 g/dL (ref 3.8–4.8)
Alpha 1: 0.3 g/dL (ref 0.2–0.3)
Alpha 2: 0.6 g/dL (ref 0.5–0.9)
Beta 2: 0.5 g/dL (ref 0.2–0.5)
Beta Globulin: 0.5 g/dL (ref 0.4–0.6)
Gamma Globulin: 1.2 g/dL (ref 0.8–1.7)
Total Protein: 7.3 g/dL (ref 6.1–8.1)

## 2021-11-19 LAB — HSV(HERPES SIMPLEX VRS) I + II AB-IGG
HAV 1 IGG,TYPE SPECIFIC AB: <0.9 index
HSV 2 IGG,TYPE SPECIFIC AB: 20.6 index — ABNORMAL HIGH

## 2021-11-19 LAB — CMV ABS, IGG+IGM (CYTOMEGALOVIRUS)
CMV IgM: <30 AU/mL
Cytomegalovirus Ab-IgG: <0.6 U/mL

## 2021-11-19 LAB — HEPATITIS B CORE ANTIBODY, IGM: Hep B C IgM: NONREACTIVE

## 2021-11-19 LAB — EPSTEIN-BARR VIRUS VCA ANTIBODY PANEL
EBV NA IgG: >600 U/mL — ABNORMAL HIGH
EBV VCA IgG: 56 U/mL — ABNORMAL HIGH
EBV VCA IgM: <36 U/mL

## 2021-11-19 LAB — CYCLIC CITRUL PEPTIDE ANTIBODY, IGG: Cyclic Citrullin Peptide Ab: 21 UNITS — ABNORMAL HIGH

## 2021-11-19 LAB — RHEUMATOID FACTOR: Rheumatoid fact SerPl-aCnc: <14 IU/mL (ref <14)

## 2021-11-19 LAB — TOXOPLASMA GONDII ANTIBODY, IGM: Toxoplasma Antibody- IgM: <8 AU/mL

## 2021-12-05 DIAGNOSIS — G4733 Obstructive sleep apnea (adult) (pediatric): Secondary | ICD-10-CM | POA: Diagnosis not present

## 2021-12-11 ENCOUNTER — Other Ambulatory Visit: Payer: Self-pay | Admitting: Cardiology

## 2021-12-11 DIAGNOSIS — I2510 Atherosclerotic heart disease of native coronary artery without angina pectoris: Secondary | ICD-10-CM

## 2021-12-11 DIAGNOSIS — E7800 Pure hypercholesterolemia, unspecified: Secondary | ICD-10-CM

## 2021-12-14 ENCOUNTER — Other Ambulatory Visit: Payer: Self-pay

## 2021-12-14 ENCOUNTER — Ambulatory Visit
Admission: RE | Admit: 2021-12-14 | Discharge: 2021-12-14 | Disposition: A | Discharge status: Home / Self Care | Payer: BC Managed Care – PPO | Source: Ambulatory Visit | Attending: Cardiology | Admitting: Cardiology

## 2021-12-14 ENCOUNTER — Other Ambulatory Visit: Payer: Self-pay | Admitting: Cardiology

## 2021-12-14 DIAGNOSIS — N2889 Other specified disorders of kidney and ureter: Secondary | ICD-10-CM

## 2021-12-14 DIAGNOSIS — N281 Cyst of kidney, acquired: Secondary | ICD-10-CM | POA: Diagnosis not present

## 2021-12-14 MED ORDER — GADOBENATE DIMEGLUMINE 529 MG/ML IV SOLN
17.0000 mL | Freq: Once | INTRAVENOUS | Status: AC | PRN
  Administered 2021-12-14: 17 mL via INTRAVENOUS

## 2021-12-16 NOTE — Progress Notes (Signed)
Let him know, the renal mass was just a simple cyst.

## 2021-12-20 NOTE — Progress Notes (Signed)
Called and spoke with patient regarding his  MRI ABDOMEN

## 2022-01-01 DIAGNOSIS — H2013 Chronic iridocyclitis, bilateral: Secondary | ICD-10-CM | POA: Diagnosis not present

## 2022-01-05 DIAGNOSIS — G4733 Obstructive sleep apnea (adult) (pediatric): Secondary | ICD-10-CM | POA: Diagnosis not present

## 2022-01-21 ENCOUNTER — Telehealth: Payer: Self-pay

## 2022-01-21 NOTE — Telephone Encounter (Signed)
Patient called and said that he experienced some chest pain yesterday, only happened once and felt fine the rest of the day/night. He wanted to know if this was something he should be concerned about

## 2022-01-21 NOTE — Telephone Encounter (Signed)
Probably acid reflux, try Omeprazole 20 mg OTC and see how you do next one to two days. Also notice if any exertional component. If  no worse, we can watch for now

## 2022-02-05 DIAGNOSIS — G4733 Obstructive sleep apnea (adult) (pediatric): Secondary | ICD-10-CM | POA: Diagnosis not present

## 2022-03-29 ENCOUNTER — Telehealth: Payer: Self-pay

## 2022-03-30 NOTE — Telephone Encounter (Signed)
Can he  go on Repatha?

## 2022-04-01 NOTE — Telephone Encounter (Signed)
Pt was informed about using Repatha. Pt was upset that you did not call him to let him know I tried to explain to him why he would be using repatha. Pt mention he was not comfortable injecting himself. Explain to him he could come every 14 days. Pt did not want to talk to me and he want you to call him.

## 2022-04-02 DIAGNOSIS — H2013 Chronic iridocyclitis, bilateral: Secondary | ICD-10-CM | POA: Diagnosis not present

## 2022-04-04 ENCOUNTER — Other Ambulatory Visit: Payer: Self-pay | Admitting: Cardiology

## 2022-04-04 ENCOUNTER — Telehealth: Payer: Self-pay | Admitting: Cardiology

## 2022-04-04 DIAGNOSIS — I2510 Atherosclerotic heart disease of native coronary artery without angina pectoris: Secondary | ICD-10-CM

## 2022-04-04 DIAGNOSIS — E7800 Pure hypercholesterolemia, unspecified: Secondary | ICD-10-CM

## 2022-04-04 MED ORDER — NEXLIZET 180-10 MG PO TABS: 1.0000 | ORAL_TABLET | Freq: Every day | ORAL | 3 refills | Status: DC

## 2022-04-04 NOTE — Telephone Encounter (Signed)
Wanted to discuss personally with me regarding his medication cost. ? ?Patient's Nexlizet costing about $600, he is already on 80 mg of atorvastatin.  Lipids under excellent control.  I extensively discussed with him regarding Repatha, patient is not comfortable taking shots.  I will try to find out about any discounts that are available, otherwise advised him that he should write a letter to the insurance company advising them that he is already on the highest dose of statin, has known coronary artery disease, and lipids are under excellent control with the present medical management and to reduce the tear from TR 7 to tier 2. ?I spent 10 minutes discussing over the telephone and additional 5 minutes reviewing his chart. ? ? ? ?  ICD-10-CM   ?1. Coronary artery disease involving native coronary artery of native heart without angina pectoris  I25.10   ?  ?2. Hypercholesteremia  E78.00   ?  ? ? ?

## 2022-04-05 DIAGNOSIS — G4733 Obstructive sleep apnea (adult) (pediatric): Secondary | ICD-10-CM | POA: Diagnosis not present

## 2022-05-05 DIAGNOSIS — G4733 Obstructive sleep apnea (adult) (pediatric): Secondary | ICD-10-CM | POA: Diagnosis not present

## 2022-05-07 DIAGNOSIS — H2013 Chronic iridocyclitis, bilateral: Secondary | ICD-10-CM | POA: Diagnosis not present

## 2022-07-12 DIAGNOSIS — G4733 Obstructive sleep apnea (adult) (pediatric): Secondary | ICD-10-CM | POA: Diagnosis not present

## 2022-07-30 DIAGNOSIS — H2013 Chronic iridocyclitis, bilateral: Secondary | ICD-10-CM | POA: Diagnosis not present

## 2022-07-30 DIAGNOSIS — G4733 Obstructive sleep apnea (adult) (pediatric): Secondary | ICD-10-CM | POA: Diagnosis not present

## 2022-08-01 ENCOUNTER — Other Ambulatory Visit: Payer: Self-pay | Admitting: Cardiology

## 2022-08-01 DIAGNOSIS — E7800 Pure hypercholesterolemia, unspecified: Secondary | ICD-10-CM

## 2022-08-01 DIAGNOSIS — I2510 Atherosclerotic heart disease of native coronary artery without angina pectoris: Secondary | ICD-10-CM

## 2022-08-03 IMAGING — MR MR ABDOMEN WO/W CM
17 series · 48 of 48 positions shown · IV contrast (multihance)
Comparison: CT chest angiogram, 11/16/2021

CLINICAL DATA: Possible left renal mass incidentally identified by
prior CT

EXAM:
MRI ABDOMEN WITHOUT AND WITH CONTRAST
TECHNIQUE: Multiplanar multisequence MR imaging of the abdomen was performed
both before and after the administration of intravenous contrast.
CONTRAST:  17mL MULTIHANCE GADOBENATE DIMEGLUMINE 529 MG/ML IV SOLN

[Series 3: T2 · coronal · 5.0mm · 1.56mm/px · 1 of 36 slices shown (1 of 3)]
[im 1/36]
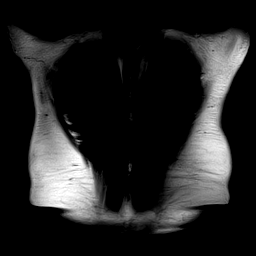

[Series 4: T1 · axial · 3.0mm · 1.19mm/px · z∈[-57,+156]mm · 5 of 144 slices shown]
[im 1/144]
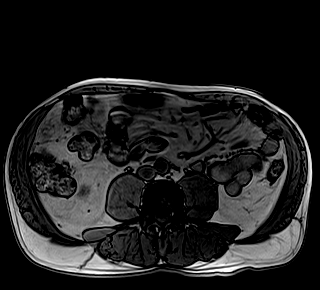
[im 36/144]
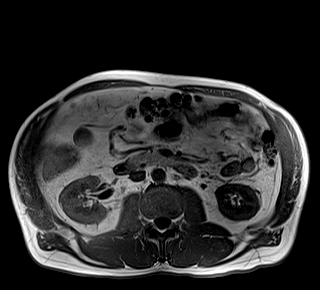
[im 72/144]
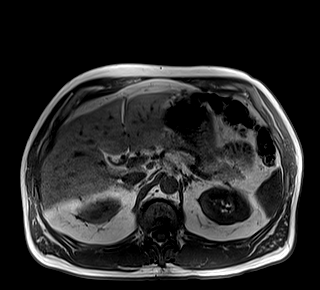
[im 108/144]
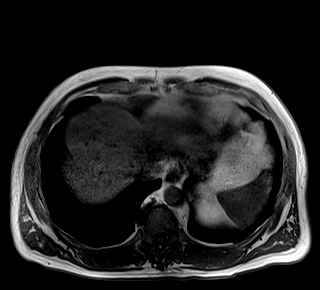
[im 144/144]
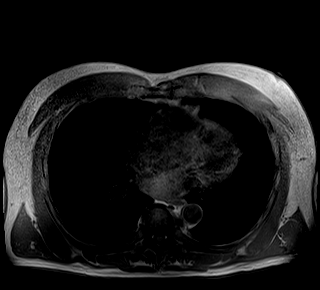

[Series 5: T2 · axial · 6.0mm · 1.22mm/px · 1 of 34 slices shown (2 of 3)]
[im 1/34]
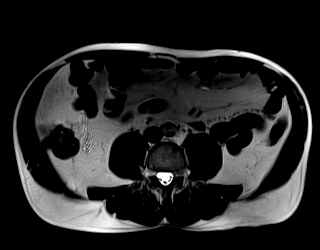

[Series 6: bSSFP · axial · 5.0mm · 1.25mm/px · z∈[-79,+161]mm · 2 of 41 slices shown]
[im 1/41]
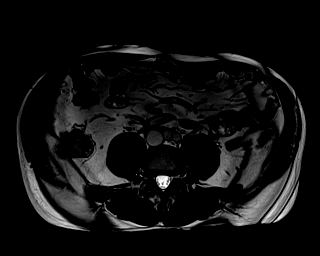
[im 41/41]
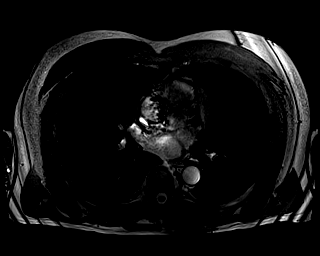

[Series 7: T2 · axial · 5.0mm · 1.48mm/px · z∈[-56,+178]mm · 2 of 40 slices shown (3 of 3)]
[im 1/40]
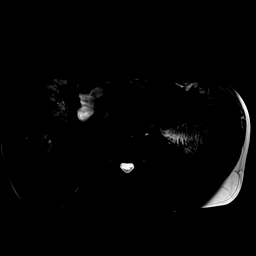
[im 40/40]
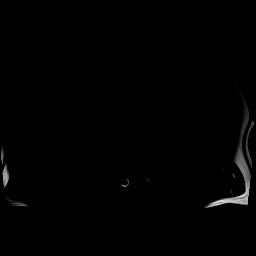

[Series 8: DWI · axial · 5.0mm · 1.42mm/px · z∈[-31,+187]mm · 5 of 114 slices shown (1 of 2)]
[im 1/114]
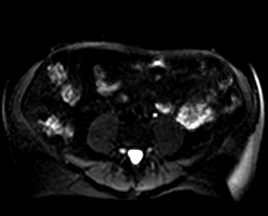
[im 29/114]
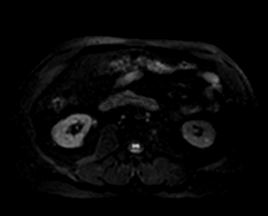
[im 57/114]
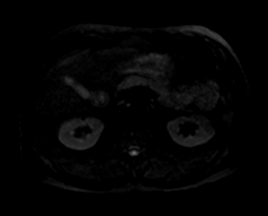
[im 85/114]
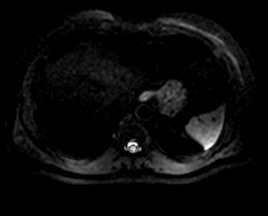
[im 114/114]
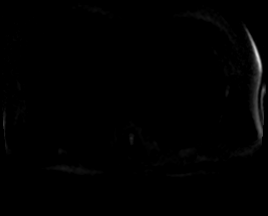

[Series 9: DWI · axial · 5.0mm · 1.42mm/px · z∈[-31,+187]mm · 2 of 38 slices shown (2 of 2)]
[im 1/38]
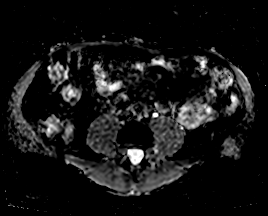
[im 38/38]
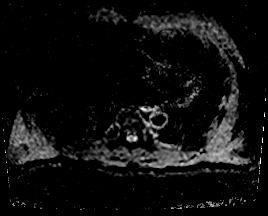

[Series 10: T1 dynamic · axial · non-contrast · 3.0mm · 1.25mm/px · z∈[-61,+152]mm · 3 of 72 slices shown]
[im 1/72]
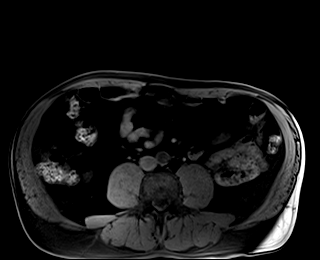
[im 36/72]
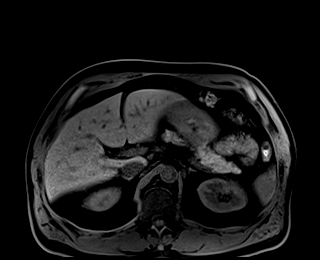
[im 72/72]
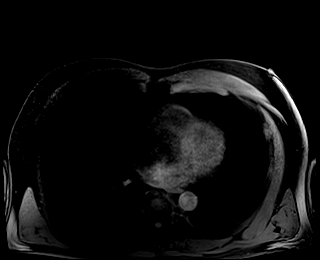

[Series 11: T1 dynamic post-contrast · axial · 3.0mm · 1.25mm/px · z∈[-61,+152]mm · 3 of 72 slices shown (1 of 9)]
[im 1/72]
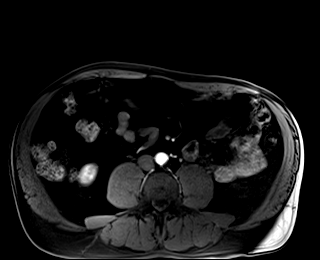
[im 36/72]
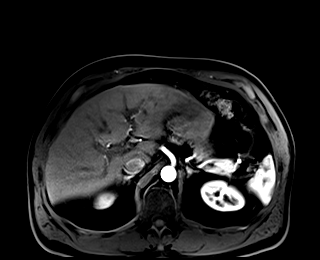
[im 72/72]
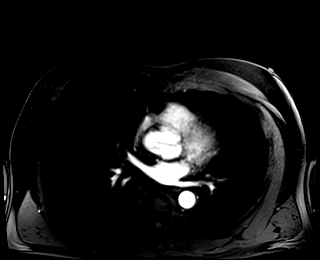

[Series 12: T1 dynamic post-contrast · axial · 3.0mm · 1.25mm/px · z∈[-61,+152]mm · 3 of 72 slices shown (2 of 9)]
[im 1/72]
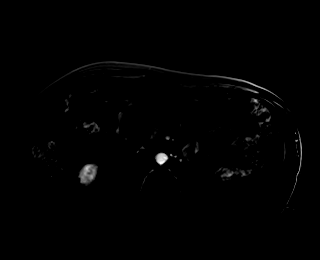
[im 36/72]
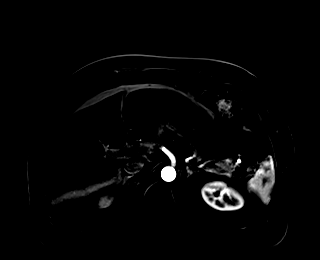
[im 72/72]
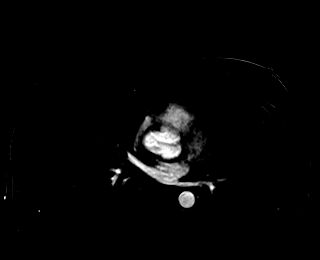

[Series 13: T1 dynamic post-contrast · axial · 3.0mm · 1.25mm/px · z∈[-61,+152]mm · 3 of 72 slices shown (3 of 9)]
[im 1/72]
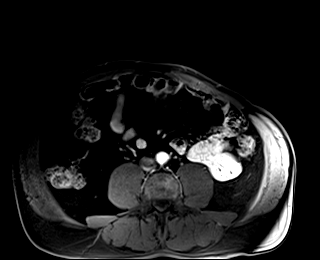
[im 36/72]
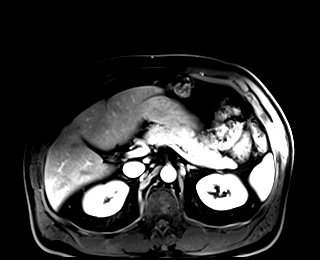
[im 72/72]
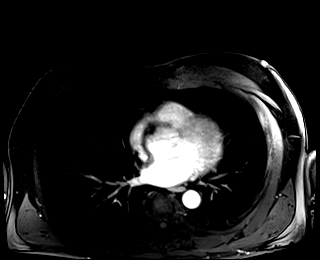

[Series 14: T1 dynamic post-contrast · axial · 3.0mm · 1.25mm/px · z∈[-61,+152]mm · 3 of 72 slices shown (4 of 9)]
[im 1/72]
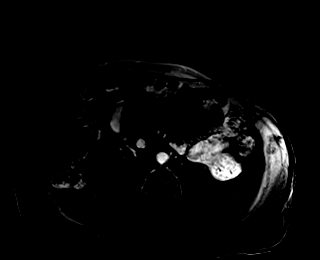
[im 36/72]
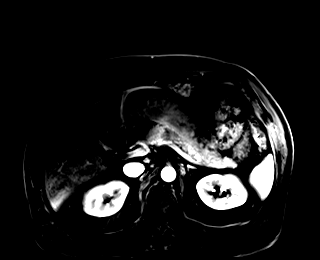
[im 72/72]
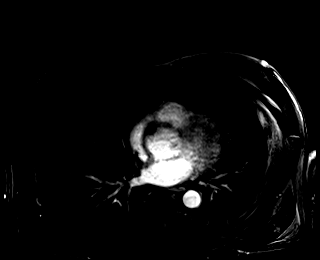

[Series 15: T1 dynamic post-contrast · axial · 3.0mm · 1.25mm/px · z∈[-61,+152]mm · 3 of 72 slices shown (5 of 9)]
[im 1/72]
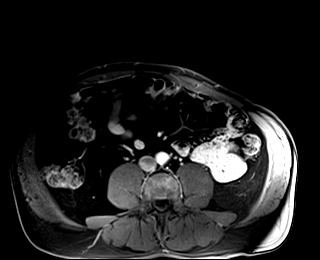
[im 36/72]
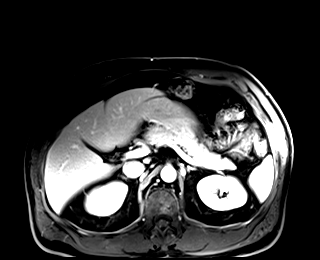
[im 72/72]
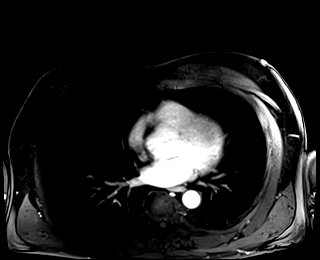

[Series 16: T1 dynamic post-contrast · axial · 3.0mm · 1.25mm/px · z∈[-61,+152]mm · 3 of 72 slices shown (6 of 9)]
[im 1/72]
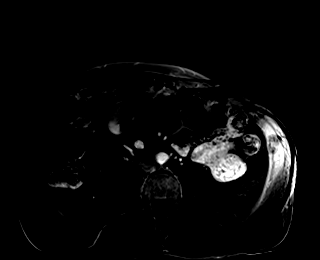
[im 36/72]
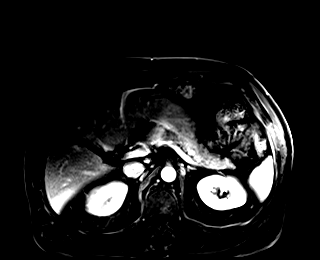
[im 72/72]
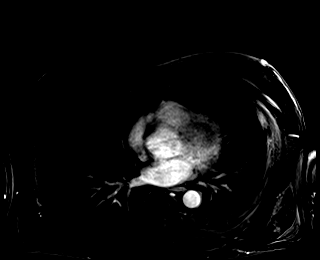

[Series 17: T1 dynamic post-contrast · coronal · 3.0mm · 1.25mm/px · 3 of 72 slices shown (7 of 9)]
[im 1/72]
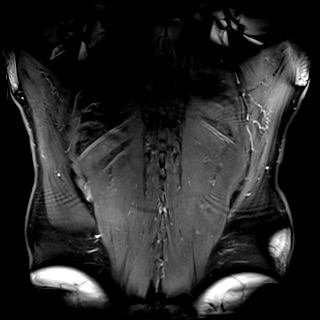
[im 36/72]
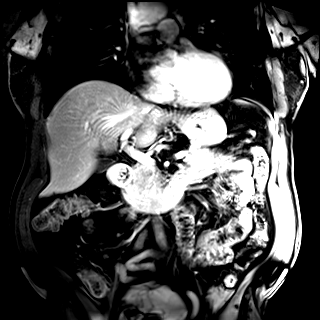
[im 72/72]
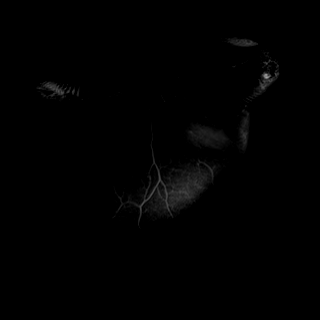

[Series 18: T1 dynamic post-contrast · axial · 3.0mm · 1.25mm/px · z∈[-61,+152]mm · 3 of 72 slices shown (8 of 9)]
[im 1/72]
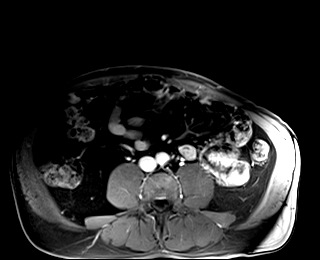
[im 36/72]
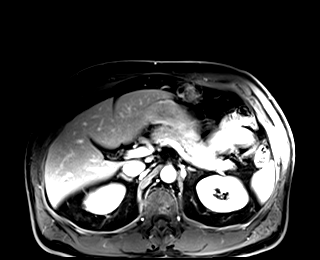
[im 72/72]
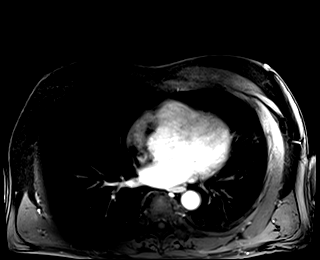

[Series 19: T1 dynamic post-contrast · axial · 3.0mm · 1.25mm/px · z∈[-61,+152]mm · 3 of 72 slices shown (9 of 9)]
[im 1/72]
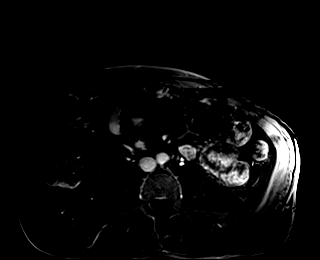
[im 36/72]
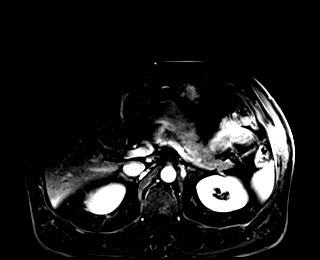
[im 72/72]
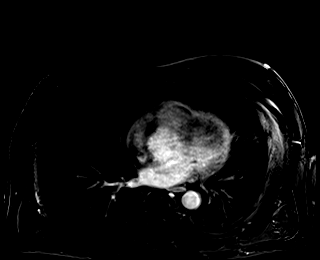

[48 of 48 positions shown; findings below may reference images not displayed]

FINDINGS: Lower chest: No acute findings.

Hepatobiliary: No mass or other parenchymal abnormality identified.
No gallstones. No biliary ductal dilatation.

Pancreas: No mass, inflammatory changes, or other parenchymal
abnormality identified. No pancreatic ductal dilatation.

Spleen:  Within normal limits in size and appearance.

Adrenals/Urinary Tract: Normal adrenals. Intrinsically T1
hyperintense, nonenhancing hemorrhagic or proteinaceous cyst of the
anterior midportion of the left kidney, corresponding to findings of
prior CT (series 10, image 45). Additional small simple cysts of the
inferior pole of left kidney. No suspicious mass or contrast
enhancement. No evidence of hydronephrosis.

Stomach/Bowel: Visualized portions within the abdomen are
unremarkable.

Vascular/Lymphatic: No pathologically enlarged lymph nodes
identified. No abdominal aortic aneurysm demonstrated.

Other:  None.

Musculoskeletal: No suspicious bone lesions identified.
IMPRESSION: Intrinsically T1 hyperintense, nonenhancing benign hemorrhagic or
proteinaceous cyst of the anterior midportion of the left kidney,
corresponding to findings of prior CT. No suspicious mass or
contrast enhancement. No further follow-up or characterization is
required.

## 2022-08-12 DIAGNOSIS — G4733 Obstructive sleep apnea (adult) (pediatric): Secondary | ICD-10-CM | POA: Diagnosis not present

## 2022-08-29 ENCOUNTER — Other Ambulatory Visit: Payer: BC Managed Care – PPO

## 2022-08-30 ENCOUNTER — Ambulatory Visit: Payer: BC Managed Care – PPO

## 2022-08-30 DIAGNOSIS — I77810 Thoracic aortic ectasia: Secondary | ICD-10-CM

## 2022-08-30 DIAGNOSIS — Q231 Congenital insufficiency of aortic valve: Secondary | ICD-10-CM | POA: Diagnosis not present

## 2022-09-01 NOTE — Progress Notes (Signed)
Please inform him that his heart function is normal, no change in the aortic valve regurgitation from prior study 1 year ago.

## 2022-09-04 NOTE — Progress Notes (Signed)
Called and spoke to patient he voiced understanding

## 2022-10-10 DIAGNOSIS — Z0000 Encounter for general adult medical examination without abnormal findings: Secondary | ICD-10-CM | POA: Diagnosis not present

## 2022-10-10 DIAGNOSIS — Z13220 Encounter for screening for lipoid disorders: Secondary | ICD-10-CM | POA: Diagnosis not present

## 2022-10-10 DIAGNOSIS — Z125 Encounter for screening for malignant neoplasm of prostate: Secondary | ICD-10-CM | POA: Diagnosis not present

## 2022-10-17 DIAGNOSIS — Z0000 Encounter for general adult medical examination without abnormal findings: Secondary | ICD-10-CM | POA: Diagnosis not present

## 2022-10-17 DIAGNOSIS — K219 Gastro-esophageal reflux disease without esophagitis: Secondary | ICD-10-CM | POA: Diagnosis not present

## 2022-10-17 DIAGNOSIS — E7800 Pure hypercholesterolemia, unspecified: Secondary | ICD-10-CM | POA: Diagnosis not present

## 2022-10-17 DIAGNOSIS — I10 Essential (primary) hypertension: Secondary | ICD-10-CM | POA: Diagnosis not present

## 2022-10-24 ENCOUNTER — Encounter: Payer: Self-pay | Admitting: Cardiology

## 2022-10-24 ENCOUNTER — Ambulatory Visit: Payer: BC Managed Care – PPO | Admitting: Cardiology

## 2022-10-24 VITALS — BP 137/84 | HR 71 | Temp 97.8°F | Resp 16 | Ht 68.5 in | Wt 180.6 lb

## 2022-10-24 DIAGNOSIS — Q231 Congenital insufficiency of aortic valve: Secondary | ICD-10-CM | POA: Diagnosis not present

## 2022-10-24 DIAGNOSIS — I77810 Thoracic aortic ectasia: Secondary | ICD-10-CM

## 2022-10-24 DIAGNOSIS — I2510 Atherosclerotic heart disease of native coronary artery without angina pectoris: Secondary | ICD-10-CM | POA: Diagnosis not present

## 2022-10-24 DIAGNOSIS — Q2381 Bicuspid aortic valve: Secondary | ICD-10-CM

## 2022-10-24 DIAGNOSIS — I10 Essential (primary) hypertension: Secondary | ICD-10-CM

## 2022-10-24 NOTE — Progress Notes (Signed)
Primary Physician/Referring:  Lawerance Cruel, MD  Patient ID: Dan [name], male    DOB: October 18, 1956, 66 y.o.   MRN: [id number]  Chief Complaint  Patient presents with   Coronary Artery Disease   Follow-up    1 year   HPI:    Dan [name]  is a 66 y.o. 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  Patient presents for annual follow-up, he is presently doing well and remains asymptomatic.  He maintains a very healthy diet and exercises on a regular basis.  Has not used sublingual nitroglycerin.  Denies chest pain, shortness of breath, PND, orthopnea, syncope, dizziness, swelling.  Past Medical History:  Diagnosis Date   GERD (gastroesophageal reflux disease)    History of hiatal hernia    Hyperlipidemia    Hypertension    Left ACL tear    Past Surgical History:  Procedure Laterality Date   CARDIAC CATHETERIZATION N/A 05/20/2016   Procedure: Left Heart Cath and Coronary Angiography;  Surgeon: Adrian Prows, MD;  Location: [facility];  Service: Cardiovascular;  Laterality: N/A;   CARDIAC CATHETERIZATION N/A 05/20/2016   Procedure: Coronary Stent Intervention;  Surgeon: Adrian Prows, MD;  Location: [place] [facility];  Service: Cardiovascular;  Laterality: N/A;   HAIR TRANSPLANT     NASAL SINUS SURGERY     Family History  Problem Relation Age of Onset   Parkinson's disease Mother    Healthy Son    Healthy Son    IgA nephropathy Son     Social History   Tobacco Use   Smoking status: Never   Smokeless tobacco: Never  Substance Use Topics   Alcohol use: Yes    Comment: social  Marital Status: Married  ROS  Review of Systems  Cardiovascular:  Negative for chest pain, dyspnea on exertion and leg swelling.  Gastrointestinal:  Negative for melena.   Objective      10/24/2022    9:08 AM 11/15/2021    8:16 AM 11/15/2021    8:08 AM   Vitals with BMI  Height 5' 8.5"  5' 8.5"  Weight 180 lbs 10 oz  183 lbs 3 oz  BMI 78.29  56.21  Systolic 308 657 846  Diastolic 84 76 75  Pulse 71 66 71    Blood pressure 137/84, pulse 71, temperature 97.8 F (36.6 C), temperature source Temporal, resp. rate 16, height 5' 8.5" (1.74 m), weight 180 lb 9.6 oz (81.9 kg). Body mass index is 27.06 kg/m.   Physical Exam Neck:     Vascular: No carotid bruit or JVD.  Cardiovascular:     Rate and Rhythm: Normal rate and regular rhythm.     Pulses: Intact distal pulses.     Heart sounds: Normal heart sounds. No murmur heard.    No gallop.  Pulmonary:     Effort: Pulmonary effort is normal.     Breath sounds: Normal breath sounds.  Abdominal:     General: Bowel sounds are normal.     Palpations: Abdomen is soft.  Musculoskeletal:        General: No swelling.    Laboratory examination:   External Labs:   Labs 10/10/2022:  Serum glucose 95 mg, BUN 21, creatinine 1.22, EGFR 66,, potassium 4.9, LFTs normal.  Total cholesterol 113, triglycerides 52, HDL 51, LDL 50.  Non-HDL cholesterol 62.  Labs 09/20/2021:  Serum glucose  87 mg, BUN 24, creatinine 1.30, EGFR 62 mL, potassium 4.3.  Total cholesterol 119, triglycerides 86, HDL 48, LDL 54, non-HDL cholesterol 71.  Vitamin D 35.0.  Allergies  No Known Allergies   Final Medications at End of Visit    Current Outpatient Medications:    aspirin 81 MG chewable tablet, Chew 1 tablet (81 mg total) by mouth daily., Disp: 30 tablet, Rfl: 0   atorvastatin (LIPITOR) 80 MG tablet, Take 1 tablet (80 mg total) by mouth daily at 6 PM., Disp: 30 tablet, Rfl: 1   Calcium Carb-Cholecalciferol 600-800 MG-UNIT TABS, Take 1 tablet by mouth daily., Disp: , Rfl:    Calcium Carbonate (CALCIUM 600 PO), Take 600 mg by mouth daily., Disp: , Rfl:    carvedilol (COREG) 6.25 MG tablet, Take 6.25 mg by mouth 2 (two) times daily., Disp: , Rfl:    Cholecalciferol (VITAMIN D-3) 1000 units CAPS, Take 1,000  Units by mouth daily., Disp: , Rfl:    cyanocobalamin 500 MCG tablet, Take 1,000 mcg by mouth daily. , Disp: , Rfl:    Multiple Vitamin (MULTIVITAMIN WITH MINERALS) TABS tablet, Take 1 tablet by mouth daily., Disp: , Rfl:    NEXLIZET 180-10 MG TABS, TAKE 1 TABLET BY MOUTH EVERY DAY, Disp: 90 tablet, Rfl: 1   nitroGLYCERIN (NITROSTAT) 0.4 MG SL tablet, Place 1 tablet (0.4 mg total) under the tongue every 5 (five) minutes as needed for chest pain., Disp: 25 tablet, Rfl: 12   omeprazole (PRILOSEC) 20 MG capsule, Take 20 mg by mouth daily., Disp: , Rfl:    telmisartan (MICARDIS) 20 MG tablet, Take 20 mg by mouth daily., Disp: , Rfl:    vitamin C (ASCORBIC ACID) 500 MG tablet, Take 500 mg by mouth daily., Disp: , Rfl:    Radiology:   CTA Chest 11/16/2021: 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 Remainder of the thoracic aorta is normal in caliber. Native coronary atherosclerosis Evidence of remote granulomatous disease  Cardiac Studies:   Coronary Angiogram 05/20/2016: 2 overlapping 3.0 x 20 and 3.0 x 28 mm Synergy DES and stenting of the proximal to mid segment of the right coronary artery with a 3.5 x 12 mm Synergy DES. Mild disease in the left coronary system. Tortuosity was evident. Mild coronary calcification evident.  05/20/2016: 2 overlapping 3.0 x 20 and 3.0 x 28 mm Synergy DES  PCV ECHOCARDIOGRAM COMPLETE 08/30/2022  Narrative Echocardiogram 08/30/2022: Normal LV systolic function with visual EF 60-65%. Left ventricle cavity is normal in size. Moderate concentric hypertrophy of the left ventricle. Normal global wall motion. Indeterminate diastolic filling pattern, normal LAP. Calculated EF 60%. Bicuspid aortic valve. Mild aortic stenosis. Mild to moderate aortic regurgitation. Moderate aortic valve leaflet calcification. Structurally normal tricuspid valve with no regurgitation. No evidence of  pulmonary hypertension. Ao root/Asc Ao measures 4.1 and is mildly dilated.  no significant change compared to prior.  EKG   EKG 10/24/2022: Normal sinus rhythm at rate of 59 bpm, LAE, left axis deviation, inferior infarct old, posterior infarct old.  Poor R progression, probably normal variant.  No evidence of ischemia, normal QT interval.  Compared to 10/24/2021, no change.  Assessment     ICD-10-CM   1. Coronary artery disease involving native coronary artery of native heart without angina pectoris  I25.10 EKG 12-Lead    2. Aortic root dilatation (HCC)  I77.810 PCV ECHOCARDIOGRAM COMPLETE    3. Bicuspid  aortic valve  Q23.1 PCV ECHOCARDIOGRAM COMPLETE    4. Primary hypertension  I10      Recommendations:   Navin Dogan  is a 66 y.o. 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  1. Coronary artery disease involving native coronary artery of native heart without angina pectoris Patient is presently doing well, no change in his EKG, has old inferior and posterior infarct.  Echocardiogram reveals preserved LVEF.  2. Aortic root dilatation (HCC) Aortic root dilatation is stable, aortic root and ascending aorta measures 4.1 cm by echocardiogram which correlates with 4.5 cm by CT angiogram.  This has been stable over the past 2 to 3 years, I will repeat echocardiogram in a year for follow-up of the same.  3. Bicuspid aortic valve No change in physical exam, no murmur appreciated.  4. Primary hypertension Blood pressure is well controlled.  Presently on appropriate medical therapy.  I also reviewed his external labs, lipids under excellent control.  No changes were done today.   Follow-up in 1 year following echocardiogram for aortic root dilation, CAD, hyperlipidemia.   Adrian Prows, MD, [facility] 10/24/2022, 9:38 AM Office: [phone] Fax:  [phone] Pager: [phone]

## 2022-10-29 DIAGNOSIS — H2013 Chronic iridocyclitis, bilateral: Secondary | ICD-10-CM | POA: Diagnosis not present

## 2022-11-04 DIAGNOSIS — G4733 Obstructive sleep apnea (adult) (pediatric): Secondary | ICD-10-CM | POA: Diagnosis not present

## 2022-11-04 DIAGNOSIS — I2510 Atherosclerotic heart disease of native coronary artery without angina pectoris: Secondary | ICD-10-CM | POA: Diagnosis not present

## 2022-11-04 DIAGNOSIS — I10 Essential (primary) hypertension: Secondary | ICD-10-CM | POA: Diagnosis not present

## 2022-11-18 ENCOUNTER — Other Ambulatory Visit: Payer: Self-pay | Admitting: Cardiology

## 2022-11-18 DIAGNOSIS — I2510 Atherosclerotic heart disease of native coronary artery without angina pectoris: Secondary | ICD-10-CM

## 2022-11-18 DIAGNOSIS — E7800 Pure hypercholesterolemia, unspecified: Secondary | ICD-10-CM

## 2022-12-02 DIAGNOSIS — G4733 Obstructive sleep apnea (adult) (pediatric): Secondary | ICD-10-CM | POA: Diagnosis not present

## 2022-12-06 DIAGNOSIS — G4733 Obstructive sleep apnea (adult) (pediatric): Secondary | ICD-10-CM | POA: Diagnosis not present

## 2022-12-20 DIAGNOSIS — Z03818 Encounter for observation for suspected exposure to other biological agents ruled out: Secondary | ICD-10-CM | POA: Diagnosis not present

## 2022-12-20 DIAGNOSIS — J029 Acute pharyngitis, unspecified: Secondary | ICD-10-CM | POA: Diagnosis not present

## 2022-12-20 DIAGNOSIS — R0981 Nasal congestion: Secondary | ICD-10-CM | POA: Diagnosis not present

## 2022-12-20 DIAGNOSIS — U071 COVID-19: Secondary | ICD-10-CM | POA: Diagnosis not present

## 2022-12-20 DIAGNOSIS — R509 Fever, unspecified: Secondary | ICD-10-CM | POA: Diagnosis not present

## 2023-01-06 DIAGNOSIS — G4733 Obstructive sleep apnea (adult) (pediatric): Secondary | ICD-10-CM | POA: Diagnosis not present

## 2023-02-06 DIAGNOSIS — G4733 Obstructive sleep apnea (adult) (pediatric): Secondary | ICD-10-CM | POA: Diagnosis not present

## 2023-02-13 DIAGNOSIS — H2013 Chronic iridocyclitis, bilateral: Secondary | ICD-10-CM | POA: Diagnosis not present

## 2023-04-04 DIAGNOSIS — G4733 Obstructive sleep apnea (adult) (pediatric): Secondary | ICD-10-CM | POA: Diagnosis not present

## 2023-05-04 DIAGNOSIS — G4733 Obstructive sleep apnea (adult) (pediatric): Secondary | ICD-10-CM | POA: Diagnosis not present

## 2023-05-26 ENCOUNTER — Other Ambulatory Visit: Payer: Self-pay

## 2023-05-26 DIAGNOSIS — R051 Acute cough: Secondary | ICD-10-CM | POA: Diagnosis not present

## 2023-05-26 DIAGNOSIS — Z03818 Encounter for observation for suspected exposure to other biological agents ruled out: Secondary | ICD-10-CM | POA: Diagnosis not present

## 2023-05-26 MED ORDER — NITROGLYCERIN 0.4 MG SL SUBL: 0.4000 mg | SUBLINGUAL_TABLET | SUBLINGUAL | 12 refills | Status: DC | PRN

## 2023-06-04 DIAGNOSIS — G4733 Obstructive sleep apnea (adult) (pediatric): Secondary | ICD-10-CM | POA: Diagnosis not present

## 2023-06-24 DIAGNOSIS — F4323 Adjustment disorder with mixed anxiety and depressed mood: Secondary | ICD-10-CM | POA: Diagnosis not present

## 2023-07-01 DIAGNOSIS — H2000 Unspecified acute and subacute iridocyclitis: Secondary | ICD-10-CM | POA: Diagnosis not present

## 2023-07-08 DIAGNOSIS — F4323 Adjustment disorder with mixed anxiety and depressed mood: Secondary | ICD-10-CM | POA: Diagnosis not present

## 2023-07-10 ENCOUNTER — Other Ambulatory Visit: Payer: Self-pay | Admitting: Cardiology

## 2023-07-10 DIAGNOSIS — I2510 Atherosclerotic heart disease of native coronary artery without angina pectoris: Secondary | ICD-10-CM

## 2023-07-10 DIAGNOSIS — I10 Essential (primary) hypertension: Secondary | ICD-10-CM

## 2023-07-10 DIAGNOSIS — E7800 Pure hypercholesterolemia, unspecified: Secondary | ICD-10-CM

## 2023-07-25 DIAGNOSIS — H524 Presbyopia: Secondary | ICD-10-CM | POA: Diagnosis not present

## 2023-07-30 DIAGNOSIS — G4733 Obstructive sleep apnea (adult) (pediatric): Secondary | ICD-10-CM | POA: Diagnosis not present

## 2023-08-06 DIAGNOSIS — H903 Sensorineural hearing loss, bilateral: Secondary | ICD-10-CM | POA: Diagnosis not present

## 2023-08-12 ENCOUNTER — Other Ambulatory Visit: Payer: Self-pay

## 2023-08-12 DIAGNOSIS — E7800 Pure hypercholesterolemia, unspecified: Secondary | ICD-10-CM

## 2023-08-12 DIAGNOSIS — I2510 Atherosclerotic heart disease of native coronary artery without angina pectoris: Secondary | ICD-10-CM

## 2023-08-12 MED ORDER — NEXLIZET 180-10 MG PO TABS: 1.0000 | ORAL_TABLET | Freq: Every day | ORAL | 2 refills | Status: DC

## 2023-08-30 DIAGNOSIS — G4733 Obstructive sleep apnea (adult) (pediatric): Secondary | ICD-10-CM | POA: Diagnosis not present

## 2023-09-01 ENCOUNTER — Other Ambulatory Visit: Payer: Self-pay

## 2023-09-01 DIAGNOSIS — E7800 Pure hypercholesterolemia, unspecified: Secondary | ICD-10-CM

## 2023-09-01 DIAGNOSIS — I2510 Atherosclerotic heart disease of native coronary artery without angina pectoris: Secondary | ICD-10-CM

## 2023-09-01 MED ORDER — NEXLIZET 180-10 MG PO TABS: 1.0000 | ORAL_TABLET | Freq: Every day | ORAL | 0 refills | Status: DC

## 2023-09-09 ENCOUNTER — Telehealth: Payer: Self-pay | Admitting: Cardiology

## 2023-09-09 NOTE — Telephone Encounter (Signed)
Pt called in asking to speak to nurse about this medication. He stated he only wanted to speak to the nurse from Dr. Verl Dicker office. Please advise.

## 2023-09-09 NOTE — Telephone Encounter (Signed)
Patient is returning call. Requesting call back asap.

## 2023-09-09 NOTE — Telephone Encounter (Signed)
Mailbox full

## 2023-09-10 ENCOUNTER — Encounter: Payer: Self-pay | Admitting: Cardiology

## 2023-09-10 NOTE — Telephone Encounter (Signed)
Patient is calling about his medication. He states he still hasn't heard back. He is very upset that this wasn't handled last week, and he almost out medication.

## 2023-09-10 NOTE — Telephone Encounter (Signed)
error 

## 2023-09-10 NOTE — Telephone Encounter (Signed)
Pt upset that he ws promised by Dr Jodi Marble nurse that she would take care of the PA for his Nexlizet.. but it was not done... he says now he needs an appeal... he is now on Medicare... and he is upset he only has a week supply left... he had to pay almost 250.00 for 30 days.   Will send to the auth pool.

## 2023-09-11 ENCOUNTER — Telehealth: Payer: Self-pay

## 2023-09-11 ENCOUNTER — Other Ambulatory Visit (HOSPITAL_COMMUNITY): Payer: Self-pay

## 2023-09-11 DIAGNOSIS — E7800 Pure hypercholesterolemia, unspecified: Secondary | ICD-10-CM

## 2023-09-11 DIAGNOSIS — I2510 Atherosclerotic heart disease of native coronary artery without angina pectoris: Secondary | ICD-10-CM

## 2023-09-11 NOTE — Telephone Encounter (Signed)
Pharmacy Patient Advocate Encounter   Received notification from Physician's Office that prior authorization for NEXLIZET is required/requested.   Insurance verification completed.   The patient is insured through [facility]  .   Per test claim: PA required; PA submitted to [employer] [place] MEDICARE via CoverMyMeds Key/confirmation #/EOC [id number] Status is pending

## 2023-09-11 NOTE — Telephone Encounter (Signed)
PA request has been Submitted. New Encounter created for follow up. For additional info see Pharmacy Prior Auth telephone encounter from 09/11/23.

## 2023-09-11 NOTE — Telephone Encounter (Signed)
[name], [place], [facility]  You7 hours ago (1:05 PM)    This is unlikely to be successful. Unless there are records elsewhere, this patient has not tried enough alternatives. Lab work also has not been rechecked in 11 months.   Will forward to triage and Dr Jacinto Halim.

## 2023-09-11 NOTE — Telephone Encounter (Addendum)
Pharmacy Patient Advocate Encounter  Received notification from [facility] MEDICARE  that Prior Authorization for NEXLIZET has been DENIED.  Full denial letter will be uploaded to the media tab. See denial reason below. CRITERIA NOT MET PER PLAN

## 2023-09-12 NOTE — Telephone Encounter (Signed)
We can use continuity of prior medication as a reason. It has been a while, with statins he had myalgias and LDL was not at target, but can certainly ask if he willing to try statins to help speed up the process.

## 2023-09-16 ENCOUNTER — Other Ambulatory Visit: Payer: Medicare Other

## 2023-09-17 ENCOUNTER — Telehealth: Payer: Self-pay | Admitting: Cardiology

## 2023-09-17 DIAGNOSIS — I2510 Atherosclerotic heart disease of native coronary artery without angina pectoris: Secondary | ICD-10-CM

## 2023-09-17 DIAGNOSIS — E7800 Pure hypercholesterolemia, unspecified: Secondary | ICD-10-CM

## 2023-09-17 NOTE — Telephone Encounter (Signed)
Called patient and adv he can get labs same day as echo.  Appointment scheduled.

## 2023-09-17 NOTE — Telephone Encounter (Signed)
Fasting CMP, CBC, Lipids. He is not diabetic and hence does not need A1c

## 2023-09-17 NOTE — Telephone Encounter (Signed)
Patient calling to see if he suppose to have lab work before his appt. Please advise

## 2023-09-19 MED ORDER — NEXLIZET 180-10 MG PO TABS: 1.0000 | ORAL_TABLET | Freq: Every day | ORAL | Status: DC

## 2023-09-19 NOTE — Telephone Encounter (Addendum)
I spoke to patient. He is very frustrated with this process. I expresed that I understood his frustration and apologized for the delay since our office last communicated with him. I explained that his insurance denied the PA. It is a non formulary item and they want him to try the formulary products. He is already on atorvastatin 80mg . I cannot see the original submission as when our tech submitted it came back duplicate request.  Dr. Jacinto Halim note said he was statin intolerant, but he is not. He is on atorvastatin 80mg . Looks like he was at some point on zetia and atorvastatin (he doesn't remember this) then changed to atorvastatin and Nexlizet. He doesn't want to do injections. Im thinking this is the formulary alternative insurance wants him to try.  Will get him 1 month of samples and when his labs come back next week, we will write an appeals letter arguing continuation of therapy.  Patient is aware of the plan. I did warn him that although we are good at writing appeals, sometimes insurances can be sticklers for they way they write their policy. We will do our best to get it approved.

## 2023-09-19 NOTE — Addendum Note (Signed)
Addended by: Margaret Pyle D on: 09/19/2023 01:30 PM   Modules accepted: Orders

## 2023-09-19 NOTE — Telephone Encounter (Signed)
Patient is following up requesting updates due to not hearing back from anyone. He expressed great frustration with our process and not hearing back from anyone when he states he was told his call would be returned within a few days or same day. He expressed the importance of his heart health. He states it has been a week and he urges that he must speak with someone regarding this matter today.

## 2023-09-19 NOTE — Telephone Encounter (Signed)
Leaving pt 4 boxes of nexlizet 180-10 mg tablets samples at [facility] front desk for pt to pick up. FYI

## 2023-09-19 NOTE — Telephone Encounter (Signed)
Please advise today if possible. Patient is very concerned.

## 2023-09-23 ENCOUNTER — Ambulatory Visit (HOSPITAL_COMMUNITY): Payer: Medicare Other | Attending: Cardiology

## 2023-09-23 ENCOUNTER — Ambulatory Visit: Payer: BC Managed Care – PPO | Admitting: Cardiology

## 2023-09-23 ENCOUNTER — Ambulatory Visit: Payer: Medicare Other

## 2023-09-23 DIAGNOSIS — E7800 Pure hypercholesterolemia, unspecified: Secondary | ICD-10-CM | POA: Diagnosis not present

## 2023-09-23 DIAGNOSIS — I2510 Atherosclerotic heart disease of native coronary artery without angina pectoris: Secondary | ICD-10-CM | POA: Insufficient documentation

## 2023-09-23 DIAGNOSIS — I77810 Thoracic aortic ectasia: Secondary | ICD-10-CM | POA: Insufficient documentation

## 2023-09-23 DIAGNOSIS — Q2381 Bicuspid aortic valve: Secondary | ICD-10-CM | POA: Diagnosis not present

## 2023-09-23 LAB — ECHOCARDIOGRAM COMPLETE
AV Mean grad: 5.7 mm[Hg]
AV Peak grad: 10.7 mm[Hg]
Ao pk vel: 1.64 m/s
Area-P 1/2: 2.97 cm2
P 1/2 time: 809 ms
S' Lateral: 2.6 cm

## 2023-09-24 ENCOUNTER — Ambulatory Visit: Payer: Medicare Other | Attending: Cardiology | Admitting: Cardiology

## 2023-09-24 ENCOUNTER — Encounter: Payer: Self-pay | Admitting: Cardiology

## 2023-09-24 VITALS — BP 120/76 | HR 93 | Resp 16 | Ht 68.0 in | Wt 177.0 lb

## 2023-09-24 DIAGNOSIS — Q2381 Bicuspid aortic valve: Secondary | ICD-10-CM

## 2023-09-24 DIAGNOSIS — I10 Essential (primary) hypertension: Secondary | ICD-10-CM | POA: Diagnosis not present

## 2023-09-24 DIAGNOSIS — I7121 Aneurysm of the ascending aorta, without rupture: Secondary | ICD-10-CM

## 2023-09-24 DIAGNOSIS — I2510 Atherosclerotic heart disease of native coronary artery without angina pectoris: Secondary | ICD-10-CM

## 2023-09-24 DIAGNOSIS — E7800 Pure hypercholesterolemia, unspecified: Secondary | ICD-10-CM | POA: Diagnosis not present

## 2023-09-24 LAB — COMPREHENSIVE METABOLIC PANEL
ALT: 28 [IU]/L (ref 0–44)
AST: 27 [IU]/L (ref 0–40)
Albumin: 4.4 g/dL (ref 3.9–4.9)
Alkaline Phosphatase: 46 [IU]/L (ref 44–121)
BUN/Creatinine Ratio: 17 (ref 10–24)
BUN: 19 mg/dL (ref 8–27)
Bilirubin Total: 0.6 mg/dL (ref 0.0–1.2)
CO2: 26 mmol/L (ref 20–29)
Calcium: 9.3 mg/dL (ref 8.6–10.2)
Chloride: 100 mmol/L (ref 96–106)
Creatinine, Ser: 1.14 mg/dL (ref 0.76–1.27)
Globulin, Total: 2.6 g/dL (ref 1.5–4.5)
Glucose: 83 mg/dL (ref 70–99)
Potassium: 4.2 mmol/L (ref 3.5–5.2)
Sodium: 141 mmol/L (ref 134–144)
Total Protein: 7 g/dL (ref 6.0–8.5)
eGFR: 71 mL/min/{1.73_m2} (ref >59)

## 2023-09-24 LAB — LIPID PANEL
Chol/HDL Ratio: 2.3 {ratio} (ref 0.0–5.0)
Cholesterol, Total: 119 mg/dL (ref 100–199)
HDL: 51 mg/dL (ref >39)
LDL Chol Calc (NIH): 53 mg/dL (ref 0–99)
Triglycerides: 73 mg/dL (ref 0–149)
VLDL Cholesterol Cal: 15 mg/dL (ref 5–40)

## 2023-09-24 LAB — CBC
Hematocrit: 47.7 % (ref 37.5–51.0)
Hemoglobin: 15.5 g/dL (ref 13.0–17.7)
MCH: 32.4 pg (ref 26.6–33.0)
MCHC: 32.5 g/dL (ref 31.5–35.7)
MCV: 100 fL — ABNORMAL HIGH (ref 79–97)
Platelets: 171 10*3/uL (ref 150–450)
RBC: 4.79 x10E6/uL (ref 4.14–5.80)
RDW: 12.3 % (ref 11.6–15.4)
WBC: 4.7 10*3/uL (ref 3.4–10.8)

## 2023-09-24 NOTE — Patient Instructions (Signed)
Medication Instructions:  Your physician recommends that you continue on your current medications as directed. Please refer to the Current Medication list given to you today.  *If you need a refill on your cardiac medications before your next appointment, please call your pharmacy*   Lab Work: none If you have labs (blood work) drawn today and your tests are completely normal, you will receive your results only by: MyChart Message (if you have MyChart) OR A paper copy in the mail If you have any lab test that is abnormal or we need to change your treatment, we will call you to review the results.   Testing/Procedures: Non-Cardiac CT Angiography (CTA), is a special type of CT scan that uses a computer to produce multi-dimensional views of major blood vessels throughout the body. In CT angiography, a contrast material is injected through an IV to help visualize the blood vessels To be done in one year   Follow-Up: At [facility], you and your health needs are our priority.  As part of our continuing mission to provide you with exceptional heart care, we have created designated Provider Care Teams.  These Care Teams include your primary Cardiologist (physician) and Advanced Practice Providers (APPs -  Physician Assistants and Nurse Practitioners) who all work together to provide you with the care you need, when you need it.  We recommend signing up for the patient portal called "MyChart".  Sign up information is provided on this After Visit Summary.  MyChart is used to connect with patients for Virtual Visits (Telemedicine).  Patients are able to view lab/test results, encounter notes, upcoming appointments, etc.  Non-urgent messages can be sent to your provider as well.   To learn more about what you can do with MyChart, go to [email or web address].    Your next appointment:   12 month(s)  Provider:   Yates Decamp, MD     Other Instructions

## 2023-09-24 NOTE — Telephone Encounter (Signed)
Appeals letter faxed 10/9

## 2023-09-24 NOTE — Progress Notes (Unsigned)
Cardiology Office Note:  .   Date:  09/24/2023  ID:  Dan [name], DOB 1956-04-10, MRN [id number] PCP: Daisy Floro, MD  [facility] Providers Cardiologist:  Yates Decamp, MD { Click to update primary MD,subspecialty MD or APP then REFRESH:1}   History of Present Illness: .   Dan [name] is a 67 y.o.  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  Patient presents for annual follow-up, he is presently doing well and remains asymptomatic.  He maintains a very healthy diet and exercises on a regular basis.  Has not used sublingual nitroglycerin.  Denies chest pain, shortness of breath, PND, orthopnea, syncope, dizziness, swelling.   Discussed the use of AI scribe software for clinical note transcription with the patient, who gave verbal consent to proceed.  History of Present Illness   The patient, with a history of heart attack and ascending aortic aneurysm, presents with concerns about his medication, Nexlizet. The patient has recently transitioned to Medicare, which has not approved Nexlizet. The patient is worried about the potential of having to switch back to his previous medication, Lipitor and Zetia, if Nexlizet is not approved. The patient has been exercising regularly and has lost six pounds since his last visit. He reports feeling well from a cardiac standpoint and has not been monitoring his blood pressure at home. The patient's cholesterol is currently well-managed with Nexlizet.      Review of Systems  Cardiovascular:  Negative for chest pain, dyspnea on exertion and leg swelling.    Risk Assessment/Calculations:   {Does this patient have ATRIAL FIBRILLATION?:[id number]}          Lab Results  Component Value Date   CHOL 119 09/23/2023   HDL 51 09/23/2023   LDLCALC 53 09/23/2023   TRIG 73 09/23/2023   CHOLHDL 2.3 09/23/2023    Lab Results  Component Value Date   NA 141 09/23/2023   K 4.2 09/23/2023   CO2 26 09/23/2023   GLUCOSE 83 09/23/2023   BUN 19 09/23/2023   CREATININE 1.14 09/23/2023   CALCIUM 9.3 09/23/2023   EGFR 71 09/23/2023   GFRNONAA >60 05/21/2016   Lab Results  Component Value Date   WBC 4.7 09/23/2023   HGB 15.5 09/23/2023   HCT 47.7 09/23/2023   MCV 100 (H) 09/23/2023   PLT 171 09/23/2023     Physical Exam:   VS:  BP 120/76 (BP Location: Left Arm, Patient Position: Sitting, Cuff Size: Normal)   Pulse 93   Resp 16   Ht 5\' 8"  (1.727 m)   Wt 177 lb (80.3 kg)   SpO2 96%   BMI 26.91 kg/m    Wt Readings from Last 3 Encounters:  09/24/23 177 lb (80.3 kg)  10/24/22 180 lb 9.6 oz (81.9 kg)  11/15/21 183 lb 3.2 oz (83.1 kg)     Physical Exam Neck:     Vascular: No carotid bruit or JVD.  Cardiovascular:     Rate and Rhythm: Normal rate and regular rhythm.     Pulses: Intact distal pulses.     Heart sounds: Normal heart sounds. No murmur heard.    No gallop.  Pulmonary:     Effort: Pulmonary effort is normal.     Breath sounds: Normal breath sounds.  Abdominal:     General: Bowel sounds are normal.     Palpations: Abdomen is soft.  Musculoskeletal:  Right lower leg: No edema.     Left lower leg: No edema.     Studies Reviewed: Marland Kitchen    EKG:    EKG Interpretation Date/Time:  Wednesday September 24 2023 16:41:47 EDT Ventricular Rate:  77 PR Interval:  150 QRS Duration:  90 QT Interval:  388 QTC Calculation: 439 R Axis:   -56  Text Interpretation: EKG 09/24/2023: Normal sinus rhythm at rate of 77 bpm, left anterior fascicular block.  Cannot exclude inferior infarct old.  Incomplete right bundle branch block.  Poor progression, cannot exclude anterolateral infarct old.  Compared to 05/21/2016, no significant change. Confirmed by Delrae Rend [phone]) on 09/24/2023 4:47:54 PM    Coronary angiogram 05/20/2016: 1. Acute ST segment elevation myocardial infarction involving  inferior and lateral wall. 2. Large very dominant right coronary artery with large PL and PDA branches supplying large area of inferior and inferolateral wall. One or percent occluded in the mid to distal segment. Successful PTCA and stenting of the distal and mid RCA with implantation of 2 overlapping 3.0 x 20 and 3.0 x 28 mm Synergy DES. Stenting of the proximal to mid segment of the right coronary artery with a 3.5 x 12 mm Synergy DES 100% occlusion in the distal end and 80% occlusion in the proximal end. Stenosis reduced to 0%. TIMI 0 flow improved to TIMI-3 flow.  3. Mild disease in the left coronary system. Tortuosity was evident. Mild coronary calcification evident.    CT angiogram of the chest with contrast 11/16/2021: Stable aneurysmal dilatation of the aortic root at the level of the sinus of Valsalva measuring 45 mm, previously 47 mm.  Remainder of the thoracic aorta is normal in caliber. Native coronary atherosclerosis Evidence of remote granulomatous disease as above.No other acute intrathoracic finding. Aortic Atherosclerosis (ICD10-I70.0).  Echocardiogram 09/23/2023: 1. Left ventricular ejection fraction, by estimation, is 55 to 60%. The left ventricle has normal function. The left ventricle has no regional wall motion abnormalities. There is mild concentric left ventricular hypertrophy. Left ventricular diastolic parameters are consistent with Grade I diastolic dysfunction (impaired relaxation). 2. Right ventricular systolic function is normal. The right ventricular size is normal. 3. The mitral valve is normal in structure. Trivial mitral valve regurgitation. No evidence of mitral stenosis. 4. The aortic valve has an indeterminant number of cusps. There is moderate calcification of the aortic valve. Aortic valve regurgitation is mild. Mild aortic valve stenosis. Aortic regurgitation PHT measures 809 msec. Aortic valve mean gradient  measures 5.7 mmHg. Aortic valve Vmax measures 1.64  m/s. 5. Aortic dilatation noted. There is mild dilatation of the aortic root, measuring 42 mm. 6. The inferior vena cava is normal in size with greater than 50% respiratory variability, suggesting right atrial pressure of 3 mmHg.   ASSESSMENT AND PLAN: .      ICD-10-CM   1. Aneurysm of ascending aorta without rupture (HCC)  I71.21 EKG 12-Lead    CT ANGIO CHEST AORTA W/CM & OR WO/CM    2. Coronary artery disease involving native coronary artery of native heart without angina pectoris  I25.10     3. Hypercholesteremia  E78.00     4. Primary hypertension  I10     5. Bicuspid aortic valve  Q23.81       Assessment and Plan  Coronary artery disease Patient remained stable without recurrence of angina pectoris. Presently tolerating carvedilol and telmisartan with excellent control of blood pressure and no recurrence of angina pectoris.    Hyperlipidemia Currently on  Nexlizet with LDL at 63. However, there are issues with Medicare approval for Nexlizet. Previously on Atorvastatin 80mg  and Zetia 10mg  with LDL slightly higher. Patient has made lifestyle changes including diet and exercise. -If Nexlizet is not approved by Medicare, switch back to Atorvastatin 80mg  and Zetia 10mg . -Recheck lipids in 3 months after potential medication change.  Ascending Aortic Aneurysm Stable on recent echocardiogram.  He is presently on an ARB and also beta-blocker.  He does have a bicuspid aortic valve but no murmur appreciated on physical exam. -Plan for follow-up CT angiogram in 1 year to monitor for any changes.  General Health Maintenance -Continue current exercise and weight loss efforts. -Follow-up visit after CT angiogram in 1 year.   Signed,  Yates Decamp, MD, [facility] 09/24/2023, 6:36 PM

## 2023-09-29 DIAGNOSIS — G4733 Obstructive sleep apnea (adult) (pediatric): Secondary | ICD-10-CM | POA: Diagnosis not present

## 2023-09-30 DIAGNOSIS — H2013 Chronic iridocyclitis, bilateral: Secondary | ICD-10-CM | POA: Diagnosis not present

## 2023-10-03 ENCOUNTER — Telehealth: Payer: Self-pay | Admitting: Pharmacist

## 2023-10-03 ENCOUNTER — Other Ambulatory Visit (HOSPITAL_COMMUNITY): Payer: Self-pay

## 2023-10-03 DIAGNOSIS — E7800 Pure hypercholesterolemia, unspecified: Secondary | ICD-10-CM

## 2023-10-03 DIAGNOSIS — I2510 Atherosclerotic heart disease of native coronary artery without angina pectoris: Secondary | ICD-10-CM

## 2023-10-03 MED ORDER — NEXLIZET 180-10 MG PO TABS: 1.0000 | ORAL_TABLET | Freq: Every day | ORAL | 11 refills | Status: DC

## 2023-10-03 NOTE — Telephone Encounter (Signed)
Awesome and thank you

## 2023-10-03 NOTE — Telephone Encounter (Signed)
We did win the appeal and Nexlizet now approved through 09/29/24. Patient made aware and is appreciative of the help. Rx sent to pharmacy.

## 2023-10-16 DIAGNOSIS — M7918 Myalgia, other site: Secondary | ICD-10-CM | POA: Diagnosis not present

## 2023-10-16 DIAGNOSIS — M67921 Unspecified disorder of synovium and tendon, right upper arm: Secondary | ICD-10-CM | POA: Diagnosis not present

## 2023-10-21 ENCOUNTER — Other Ambulatory Visit (HOSPITAL_COMMUNITY): Payer: Self-pay

## 2023-10-21 ENCOUNTER — Other Ambulatory Visit: Payer: BC Managed Care – PPO

## 2023-10-27 DIAGNOSIS — M5410 Radiculopathy, site unspecified: Secondary | ICD-10-CM | POA: Diagnosis not present

## 2023-10-30 ENCOUNTER — Ambulatory Visit: Payer: BC Managed Care – PPO | Admitting: Cardiology

## 2023-10-30 ENCOUNTER — Ambulatory Visit: Payer: Medicare Other | Admitting: Neurology

## 2023-10-30 ENCOUNTER — Encounter: Payer: Self-pay | Admitting: Neurology

## 2023-10-30 VITALS — BP 115/76 | HR 77 | Ht 68.0 in | Wt 175.8 lb

## 2023-10-30 DIAGNOSIS — M79601 Pain in right arm: Secondary | ICD-10-CM | POA: Diagnosis not present

## 2023-10-30 DIAGNOSIS — M549 Dorsalgia, unspecified: Secondary | ICD-10-CM

## 2023-10-30 DIAGNOSIS — G4733 Obstructive sleep apnea (adult) (pediatric): Secondary | ICD-10-CM

## 2023-10-30 DIAGNOSIS — I2510 Atherosclerotic heart disease of native coronary artery without angina pectoris: Secondary | ICD-10-CM

## 2023-10-30 DIAGNOSIS — R29898 Other symptoms and signs involving the musculoskeletal system: Secondary | ICD-10-CM

## 2023-10-30 NOTE — Progress Notes (Signed)
[facility]  PATIENT: Dan [name] DOB: 08-30-1956  REFERRING DOCTOR OR PCP: Gildardo Cranker, MD SOURCE: Patient, notes from primary care  _________________________________   HISTORICAL  CHIEF COMPLAINT:  Chief Complaint  Patient presents with   New Patient (Initial Visit)    Pt in room 11 alone. New patient. Pt said he was having pain in right arm, right side of back. Pt said about 1 week he had a wave a pain in upper arm and pain in back. Taking prednisone 10 mg now.     HISTORY OF PRESENT ILLNESS:  I had the pleasure of seeing patient, Dan [name], at [facility] for neurologic consultation regarding his right arm pain.  He also has sleep apnea and this morning to change practices.  He is a 67 yo man who had the onset of right arm pain x 4 weeks.  At the time symptoms started he notes he was doing a lot of typing.   Pain was under the scapular and down the arm.   He was felt to have a knot in a muscle near the scapula.    There was pain near the medial posterior elbow as well.  He did not have neck pain but scapula pain worsened whe he raised his right arm.     He was prescribed a muscle relaxant  and Tylenol without much benefit.   He re-presented to PCP 3 days ago and was prescribed a steroid pack.  This has helped with much less pain in last 2 days.      He has OSA and was treated with CPAP.   He had good efficacy on a download in 2016.   His machine stopped working and he has been without CPAP x 20+ days and actually feels better.   He did a HST with [facility] last year (we do not have records).    He was using [employer] as the DME company.  I do not have a copy of his last sleep study we have requested.  He is on bASA for h/o CAD/MI.     He exercises regularly with weights.    He had a right brachial plexus injury in 1988 and an arm injury in 2018.  He had good recovery from these.  A few months ago, he felt that the right arm strength was equal or  mildly better than the left arm strength..      EPWORTH SLEEPINESS SCALE  On a scale of 0 - 3 what is the chance of dozing:  Sitting and Reading:   0 Watching TV:    1 Sitting inactive in a public place: 0 Passenger in car for one hour: 1 Lying down to rest in the afternoon: 3 Sitting and talking to someone: 0 Sitting quietly after lunch:  2 In a car, stopped in traffic:  0  Total (out of 24):    7/24   No EDS   REVIEW OF SYSTEMS: Constitutional: No fevers, chills, sweats, or change in appetite Eyes: No visual changes, double vision, eye pain Ear, nose and throat: No hearing loss, ear pain, nasal congestion, sore throat Cardiovascular: No chest pain, palpitations Respiratory:  No shortness of breath at rest or with exertion.   No wheezes GastrointestinaI: No nausea, vomiting, diarrhea, abdominal pain, fecal incontinence Genitourinary:  No dysuria, urinary retention or frequency.  No nocturia. Musculoskeletal: As above.   Integumentary: No rash, pruritus, skin lesions Neurological: as above Psychiatric: No depression at this time.  No anxiety Endocrine: No palpitations, diaphoresis, change in appetite, change in weigh or increased thirst Hematologic/Lymphatic:  No anemia, purpura, petechiae. Allergic/Immunologic: No itchy/runny eyes, nasal congestion, recent allergic reactions, rashes  ALLERGIES: No Known Allergies  HOME MEDICATIONS:  Current Outpatient Medications:    aspirin 81 MG chewable tablet, Chew 1 tablet (81 mg total) by mouth daily., Disp: 30 tablet, Rfl: 0   atorvastatin (LIPITOR) 80 MG tablet, Take 1 tablet (80 mg total) by mouth daily at 6 PM., Disp: 30 tablet, Rfl: 1   Bempedoic Acid-Ezetimibe (NEXLIZET) 180-10 MG TABS, Take 1 tablet by mouth daily., Disp: 30 tablet, Rfl: 11   Calcium Carb-Cholecalciferol 600-800 MG-UNIT TABS, Take 1 tablet by mouth daily., Disp: , Rfl:    carvedilol (COREG) 6.25 MG tablet, Take 6.25 mg by mouth 2 (two) times daily., Disp: ,  Rfl:    Cholecalciferol (VITAMIN D-3) 1000 units CAPS, Take 1,000 Units by mouth daily., Disp: , Rfl:    cyanocobalamin 500 MCG tablet, Take 1,000 mcg by mouth daily. , Disp: , Rfl:    Multiple Vitamin (MULTIVITAMIN WITH MINERALS) TABS tablet, Take 1 tablet by mouth daily., Disp: , Rfl:    nitroGLYCERIN (NITROSTAT) 0.4 MG SL tablet, Place 1 tablet (0.4 mg total) under the tongue every 5 (five) minutes as needed for chest pain., Disp: 25 tablet, Rfl: 12   omeprazole (PRILOSEC) 20 MG capsule, Take 20 mg by mouth daily., Disp: , Rfl:    predniSONE (DELTASONE) 10 MG tablet, Take 10 mg by mouth. Take 5 tablets by mouth once daily for 3 days. 4 tablets daily for 3 days, 3 tablets for 3 days, 2 tablets daily for 3 days, then 1 tablet daily for 3 days., Disp: , Rfl:    telmisartan (MICARDIS) 20 MG tablet, Take 20 mg by mouth daily., Disp: , Rfl:    vitamin C (ASCORBIC ACID) 500 MG tablet, Take 500 mg by mouth daily., Disp: , Rfl:   PAST MEDICAL HISTORY: Past Medical History:  Diagnosis Date   GERD (gastroesophageal reflux disease)    History of hiatal hernia    Hyperlipidemia    Hypertension    Left ACL tear     PAST SURGICAL HISTORY: Past Surgical History:  Procedure Laterality Date   CARDIAC CATHETERIZATION N/A 05/20/2016   Procedure: Left Heart Cath and Coronary Angiography;  Surgeon: Yates Decamp, MD;  Location: [facility] INVASIVE CV LAB;  Service: Cardiovascular;  Laterality: N/A;   CARDIAC CATHETERIZATION N/A 05/20/2016   Procedure: Coronary Stent Intervention;  Surgeon: Yates Decamp, MD;  Location: [facility] INVASIVE CV LAB;  Service: Cardiovascular;  Laterality: N/A;   HAIR TRANSPLANT     NASAL SINUS SURGERY      FAMILY HISTORY: Family History  Problem Relation Age of Onset   Parkinson's disease Mother    Healthy Son    Healthy Son    IgA nephropathy Son     SOCIAL HISTORY: Social History   Socioeconomic History   Marital status: Married    Spouse name: Not on file   Number of children: 2    Years of education: Not on file   Highest education level: Not on file  Occupational History   Not on file  Tobacco Use   Smoking status: Never   Smokeless tobacco: Never  Vaping Use   Vaping status: Never Used  Substance and Sexual Activity   Alcohol use: Yes    Comment: social   Drug use: No   Sexual activity: Yes  Other Topics Concern  Not on file  Social History Narrative   Right handed    Wear contacts   Social Determinants of Health   Financial Resource Strain: Not on file  Food Insecurity: Not on file  Transportation Needs: Not on file  Physical Activity: Not on file  Stress: Not on file  Social Connections: Not on file  Intimate Partner Violence: Unknown (03/21/2022)   [facility]   HITS    Physically Hurt: Not on file    Insult or Talk Down To: Not on file    Threaten Physical Harm: Not on file    Scream or Curse: Not on file       PHYSICAL EXAM  Vitals:   10/30/23 0850  BP: 115/76  Pulse: 77  Weight: 175 lb 12.8 oz (79.7 kg)  Height: 5\' 8"  (1.727 m)    Body mass index is 26.73 kg/m.   General: The patient is well-developed and well-nourished and in no acute distress  HEENT:  Head is [place]/AT.  Sclera are anicteric.    Neck: No carotid bruits are noted.  The neck is nontender.  Cardiovascular: The heart has a regular rate and rhythm with a normal S1 and S2. There were no murmurs, gallops or rubs.    Skin: Extremities are without rash or  edema.  Musculoskeletal:  Back is nontender  Neurologic Exam  Mental status: The patient is alert and oriented x 3 at the time of the examination. The patient has apparent normal recent and remote memory, with an apparently normal attention span and concentration ability.   Speech is normal.  Cranial nerves: Extraocular movements are full.  There is good facial sensation to soft touch bilaterally.Facial strength is normal.  Trapezius and sternocleidomastoid strength is normal.  No dysarthria is noted.    No obvious hearing deficits are noted.  Motor:  Muscle bulk is normal.   Tone is normal.  Strength was 4+/5 in the right triceps, pronators and finger extensors but 5/5 on the left.  Other muscles of the right arm are 5/5.  Sensory: Sensory testing is intact to pinprick, soft touch and vibration sensation in all 4 extremities.  Coordination: Cerebellar testing reveals good finger-nose-finger and heel-to-shin bilaterally.  Gait and station: Station is normal.   Gait is normal. Tandem gait is normal. Romberg is negative.   Reflexes: Deep tendon reflexes are symmetric and normal bilaterally.      DIAGNOSTIC DATA (LABS, IMAGING, TESTING) - I reviewed patient records, labs, notes, testing and imaging myself where available.  Lab Results  Component Value Date   WBC 4.7 09/23/2023   HGB 15.5 09/23/2023   HCT 47.7 09/23/2023   MCV 100 (H) 09/23/2023   PLT 171 09/23/2023      Component Value Date/Time   NA 141 09/23/2023 1350   K 4.2 09/23/2023 1350   CL 100 09/23/2023 1350   CO2 26 09/23/2023 1350   GLUCOSE 83 09/23/2023 1350   GLUCOSE 106 (H) 05/21/2016 0528   BUN 19 09/23/2023 1350   CREATININE 1.14 09/23/2023 1350   CALCIUM 9.3 09/23/2023 1350   PROT 7.0 09/23/2023 1350   ALBUMIN 4.4 09/23/2023 1350   AST 27 09/23/2023 1350   ALT 28 09/23/2023 1350   ALKPHOS 46 09/23/2023 1350   BILITOT 0.6 09/23/2023 1350   GFRNONAA >60 05/21/2016 0528   GFRAA >60 05/21/2016 0528   Lab Results  Component Value Date   CHOL 119 09/23/2023   HDL 51 09/23/2023  LDLCALC 53 09/23/2023   TRIG 73 09/23/2023   CHOLHDL 2.3 09/23/2023   No results found for: "HGBA1C" No results found for: "VITAMINB12" No results found for: "TSH"     ASSESSMENT AND PLAN  Right arm pain  Coronary artery disease involving native coronary artery of native heart without angina pectoris  Right arm weakness  Upper back pain  OSA (obstructive sleep apnea)  In summary, Mr.  Cordia is a 67 year old man with pain in the lower neck/scapular region into the right arm.  His exam showed mild weakness in a couple C7 innervated muscles but was otherwise normal.  I discussed with him that I feel he likely has a mild C7 radiculopathy explain his symptoms.  He feels much better after starting the prednisone.  Because he is doing better he can speed up the taper.  He is on aspirin for coronary artery disease and needs to avoid chronic use of NSAIDs but can continue Tylenol.  If the pain worsens again or if right arm weakness does not improve we will need to check MRI cervical spine.  His second issue is obstructive sleep apnea.  He has had a CPAP machine that recently became nonfunctional.  He has had a home sleep study but is having difficulty getting a new CPAP machine and would like to transfer his sleep care to [place].  I will try to get the old records and we can set up DME if appropriate.  He actually feels better off of the CPAP and if he has mild obstructive sleep apnea, treatment would be more optional.  If he has moderate to severe, I would have to recommend that he get back on CPAP.  He will return to see me in 4 months or sooner if there are new or worsening neurologic symptoms.  Grayling Schranz A. Epimenio Foot, MD, [facility] 10/30/2023, 12:36 PM Certified in Neurology, Clinical Neurophysiology, Sleep Medicine and Neuroimaging  [facility] [address], [address] [place], [place] [address] [phone]

## 2023-11-03 ENCOUNTER — Telehealth: Payer: Self-pay | Admitting: Neurology

## 2023-11-03 ENCOUNTER — Telehealth: Payer: Self-pay | Admitting: *Deleted

## 2023-11-03 DIAGNOSIS — G4733 Obstructive sleep apnea (adult) (pediatric): Secondary | ICD-10-CM

## 2023-11-03 NOTE — Telephone Encounter (Signed)
LVM returning pt call. 

## 2023-11-03 NOTE — Telephone Encounter (Signed)
Gave to MD to review 

## 2023-11-03 NOTE — Telephone Encounter (Signed)
Pt called and left a VERY disgusting message insulting the practice and stated "you have one chance to call me back or I will be going to another practice, in fact if you dont call back that may be a blessing." Please advise.

## 2023-11-04 NOTE — Telephone Encounter (Addendum)
Spoke with Dr. Epimenio Foot. He is okay with proceeding with ordering Autocpap 5-15cm, mask of pt choice, heated humidifier.   I called pt. Relayed Dr. Epimenio Foot ok'd placing order. He would like to be set up with Resmed arisense 10 autocpap climate line tubing. I placed order and sent community message to [employer] that order placed.   I asked that pt call the day he gets new machine to set up initial cpap f/u. Explained it needs to be 31-90 days from set up date.

## 2023-11-04 NOTE — Telephone Encounter (Addendum)
Called pt. He needs new CPAP machine. PCP sent sleep study results for Dr. Epimenio Foot to review. I confirmed we received the results yesterday. Ready for MD to review. MD out yesterday. He verbalized understanding.   He wants a specific CPAP machine. He will call back with this info before we place order if Dr. Epimenio Foot approves to order new CPAP.  Has no interest in mychart. I did discuss this option with him.  I did apologize for the difficulties he had getting through on the phones yesterday. Advised him to let us know if he has any further issues.

## 2023-11-04 NOTE — Addendum Note (Signed)
Addended by: Marcelina Morel L on: 11/04/2023 11:14 AM   Modules accepted: Orders

## 2023-11-06 DIAGNOSIS — G4733 Obstructive sleep apnea (adult) (pediatric): Secondary | ICD-10-CM | POA: Diagnosis not present

## 2023-11-07 ENCOUNTER — Other Ambulatory Visit (HOSPITAL_COMMUNITY): Payer: Self-pay

## 2023-11-11 ENCOUNTER — Telehealth: Payer: Self-pay | Admitting: Cardiology

## 2023-11-11 NOTE — Telephone Encounter (Signed)
I spoke with patient and gave him message from Dr Jacinto Halim

## 2023-11-11 NOTE — Telephone Encounter (Signed)
Pt would like to know if he can talk with Dr. Jacinto Halim about his thoughts on if the pt needs a CPAP machine or not. Please advise

## 2023-11-11 NOTE — Telephone Encounter (Signed)
I spoke with patient. He reports he has been using CPAP for sleep apnea since 2016.  His machine recently broke and he has not used CPAP for 5 weeks.  States he has been sleeping better recently.  Patient reports last sleep study was done about a year ago at [facility] (Dr Betti Cruz).  Dr Betti Cruz will be leaving the practice so patient has transferred care to [facility] (Dr Epimenio Foot) Patient reports Dr Epimenio Foot reviewed sleep study from [facility] and told patient his sleep apnea was a mild case and patient could consider stopping the use of CPAP but that it might be wise to stay on it due to his history of a heart attack.  Patient would like Dr Verl Dicker opinion as to whether he should continue using CPAP or if it could be stopped.

## 2023-11-11 NOTE — Telephone Encounter (Signed)
I agree, it is not just mild or severe, drop in oxygen with sleep apnea affects heart and also brain. So I prefer to continue CPAP

## 2023-11-17 NOTE — Telephone Encounter (Signed)
Sent community message to [facility] about this, waiting on response

## 2023-11-17 NOTE — Telephone Encounter (Signed)
Pt called stating that [employer] called him to inform him that the office is needing to reach out to them about the heating attachment that is to be with the cpap machine. Please advise.

## 2023-11-18 ENCOUNTER — Other Ambulatory Visit: Payer: Self-pay | Admitting: *Deleted

## 2023-11-18 DIAGNOSIS — G4733 Obstructive sleep apnea (adult) (pediatric): Secondary | ICD-10-CM

## 2023-11-18 NOTE — Telephone Encounter (Signed)
Received response from [facility] hat order now has to say "heated humidifier, all supplies as needed". This is insurance requirement. New order placed and [employer] notified of new order via community message.

## 2023-11-18 NOTE — Telephone Encounter (Signed)
I called pt. Relayed we got things updated and new order to [employer]. Made him aware insurance required certain verbiage on order. He verbalized understanding.

## 2023-12-03 DIAGNOSIS — E7800 Pure hypercholesterolemia, unspecified: Secondary | ICD-10-CM | POA: Diagnosis not present

## 2023-12-03 DIAGNOSIS — Z125 Encounter for screening for malignant neoplasm of prostate: Secondary | ICD-10-CM | POA: Diagnosis not present

## 2023-12-03 DIAGNOSIS — I10 Essential (primary) hypertension: Secondary | ICD-10-CM | POA: Diagnosis not present

## 2023-12-03 LAB — LAB REPORT - SCANNED
Creatinine, POC: 143 mg/dL
EGFR: 74
Microalb Creat Ratio: 6.6
Microalbumin, Urine: 0.94

## 2023-12-04 DIAGNOSIS — G4733 Obstructive sleep apnea (adult) (pediatric): Secondary | ICD-10-CM | POA: Diagnosis not present

## 2023-12-05 DIAGNOSIS — J069 Acute upper respiratory infection, unspecified: Secondary | ICD-10-CM | POA: Diagnosis not present

## 2023-12-06 DIAGNOSIS — G4733 Obstructive sleep apnea (adult) (pediatric): Secondary | ICD-10-CM | POA: Diagnosis not present

## 2023-12-08 DIAGNOSIS — I10 Essential (primary) hypertension: Secondary | ICD-10-CM | POA: Diagnosis not present

## 2023-12-08 DIAGNOSIS — E7800 Pure hypercholesterolemia, unspecified: Secondary | ICD-10-CM | POA: Diagnosis not present

## 2023-12-08 DIAGNOSIS — K219 Gastro-esophageal reflux disease without esophagitis: Secondary | ICD-10-CM | POA: Diagnosis not present

## 2023-12-08 DIAGNOSIS — Z23 Encounter for immunization: Secondary | ICD-10-CM | POA: Diagnosis not present

## 2023-12-08 DIAGNOSIS — Z0000 Encounter for general adult medical examination without abnormal findings: Secondary | ICD-10-CM | POA: Diagnosis not present

## 2023-12-08 DIAGNOSIS — R059 Cough, unspecified: Secondary | ICD-10-CM | POA: Diagnosis not present

## 2023-12-18 DIAGNOSIS — K219 Gastro-esophageal reflux disease without esophagitis: Secondary | ICD-10-CM | POA: Diagnosis not present

## 2023-12-30 DIAGNOSIS — H20023 Recurrent acute iridocyclitis, bilateral: Secondary | ICD-10-CM | POA: Diagnosis not present

## 2024-01-04 DIAGNOSIS — G4733 Obstructive sleep apnea (adult) (pediatric): Secondary | ICD-10-CM | POA: Diagnosis not present

## 2024-01-06 DIAGNOSIS — G4733 Obstructive sleep apnea (adult) (pediatric): Secondary | ICD-10-CM | POA: Diagnosis not present

## 2024-02-04 DIAGNOSIS — G4733 Obstructive sleep apnea (adult) (pediatric): Secondary | ICD-10-CM | POA: Diagnosis not present

## 2024-02-19 DIAGNOSIS — K219 Gastro-esophageal reflux disease without esophagitis: Secondary | ICD-10-CM | POA: Diagnosis not present

## 2024-03-03 DIAGNOSIS — G4733 Obstructive sleep apnea (adult) (pediatric): Secondary | ICD-10-CM | POA: Diagnosis not present

## 2024-03-25 ENCOUNTER — Telehealth: Payer: Self-pay | Admitting: Cardiology

## 2024-03-25 NOTE — Telephone Encounter (Signed)
 Patient wants a call back to discuss orders for test to be completed prior to his annual visit in October.

## 2024-03-25 NOTE — Telephone Encounter (Signed)
 Pt was calling about scheduling the CT Dr. Jacinto Halim recommended.  Aware CT is scheduled for 09/21/24.  Will mail him the appt date/time and address per his request.  Home address verified. Pt appreciates the call back about this.

## 2024-04-01 DIAGNOSIS — H2013 Chronic iridocyclitis, bilateral: Secondary | ICD-10-CM | POA: Diagnosis not present

## 2024-04-18 DIAGNOSIS — G4733 Obstructive sleep apnea (adult) (pediatric): Secondary | ICD-10-CM | POA: Diagnosis not present

## 2024-05-12 ENCOUNTER — Telehealth: Payer: Self-pay | Admitting: Cardiology

## 2024-05-12 MED ORDER — NEXLIZET 180-10 MG PO TABS: 1.0000 | ORAL_TABLET | Freq: Every day | ORAL | 1 refills | Status: AC

## 2024-05-12 NOTE — Telephone Encounter (Signed)
*  STAT* If patient is at the pharmacy, call can be transferred to refill team.   1. Which medications need to be refilled? (please list name of each medication and dose if known)  Bempedoic Acid -Ezetimibe  (NEXLIZET ) 180-10 MG TABS   2. Which pharmacy/location (including street and city if local pharmacy) is medication to be sent to?  [id number] - [place], [place] - [address]    3. Do they need a 30 day or 90 day supply? 90

## 2024-05-12 NOTE — Telephone Encounter (Signed)
 Pt's medication was sent to pt's pharmacy as requested. Confirmation received.

## 2024-05-17 ENCOUNTER — Other Ambulatory Visit (HOSPITAL_COMMUNITY): Payer: Self-pay

## 2024-05-17 ENCOUNTER — Telehealth: Payer: Self-pay | Admitting: Pharmacy Technician

## 2024-05-17 NOTE — Telephone Encounter (Signed)
 Hi, the patient is asking about samples of nexlizet  while we work on the tier exception. He is completely out. Thank you.

## 2024-05-17 NOTE — Telephone Encounter (Signed)
 Cancel request. Tier exception was denied

## 2024-05-17 NOTE — Telephone Encounter (Signed)
  Tier exception denial. I got the patient a grant.   Patient Advocate Encounter   The patient was approved for a [employer] grant that will help cover the cost of nexlizet  Total amount awarded, 2500.00.  Effective: 04/17/24 - 04/16/25   [id number] WUJ:[id number] WGNFA:[id number] [id number] [employer] ID: [id number]   Pharmacy provided with approval and processing information. Patient informed via telephone

## 2024-05-19 DIAGNOSIS — G4733 Obstructive sleep apnea (adult) (pediatric): Secondary | ICD-10-CM | POA: Diagnosis not present

## 2024-06-01 ENCOUNTER — Telehealth: Payer: Self-pay | Admitting: Cardiology

## 2024-06-01 MED ORDER — NITROGLYCERIN 0.4 MG SL SUBL: 0.4000 mg | SUBLINGUAL_TABLET | SUBLINGUAL | 1 refills | Status: AC | PRN

## 2024-06-01 NOTE — Telephone Encounter (Signed)
 Pt's medication was sent to pt's pharmacy as requested. Confirmation received.

## 2024-06-01 NOTE — Telephone Encounter (Signed)
*  STAT* If patient is at the pharmacy, call can be transferred to refill team.   1. Which medications need to be refilled? (please list name of each medication and dose if known) nitroGLYCERIN  (NITROSTAT ) 0.4 MG SL tablet    2. Would you like to learn more about the convenience, safety, & potential cost savings by using the [facility]?    3. Are you open to using the [facility] (Type [facility]   4. Which pharmacy/location (including street and city if local pharmacy) is medication to be sent to? [id number] - [place], [place] - [address]    5. Do they need a 30 day or 90 day supply?  90 day

## 2024-06-07 ENCOUNTER — Telehealth: Payer: Self-pay | Admitting: Pharmacy Technician

## 2024-06-07 NOTE — Telephone Encounter (Signed)
 Patient called and said his insurance sent him the form to try for a tier exception again. Patient to send me the form and we try again for tier exception

## 2024-06-08 NOTE — Telephone Encounter (Signed)
 Faxed tier exception appeal to [phone]  Too soon until 07/25/24 to test copay

## 2024-06-10 ENCOUNTER — Other Ambulatory Visit (HOSPITAL_COMMUNITY): Payer: Self-pay

## 2024-06-11 ENCOUNTER — Other Ambulatory Visit (HOSPITAL_COMMUNITY): Payer: Self-pay

## 2024-06-11 NOTE — Telephone Encounter (Signed)
 I called insurance and they said they received the non-formaulary exception appeal request and they are working on it and should hear in 4 days

## 2024-06-14 NOTE — Telephone Encounter (Signed)
 Per pt his tier exception was approved as a tier 4 medication. He still has the [employer] grant approval. I called and left the patient a message.

## 2024-07-09 DIAGNOSIS — G4733 Obstructive sleep apnea (adult) (pediatric): Secondary | ICD-10-CM | POA: Diagnosis not present

## 2024-08-03 DIAGNOSIS — H524 Presbyopia: Secondary | ICD-10-CM | POA: Diagnosis not present

## 2024-08-09 DIAGNOSIS — G4733 Obstructive sleep apnea (adult) (pediatric): Secondary | ICD-10-CM | POA: Diagnosis not present

## 2024-09-02 DIAGNOSIS — H2013 Chronic iridocyclitis, bilateral: Secondary | ICD-10-CM | POA: Diagnosis not present

## 2024-09-15 ENCOUNTER — Encounter: Payer: Self-pay | Admitting: Cardiology

## 2024-09-21 ENCOUNTER — Ambulatory Visit
Admission: RE | Admit: 2024-09-21 | Discharge: 2024-09-21 | Disposition: A | Discharge status: Home / Self Care | Payer: Medicare Other | Source: Ambulatory Visit | Attending: Cardiology | Admitting: Cardiology

## 2024-09-21 DIAGNOSIS — I7121 Aneurysm of the ascending aorta, without rupture: Secondary | ICD-10-CM

## 2024-09-21 DIAGNOSIS — I2510 Atherosclerotic heart disease of native coronary artery without angina pectoris: Secondary | ICD-10-CM | POA: Diagnosis not present

## 2024-09-21 MED ORDER — IOPAMIDOL (ISOVUE-370) INJECTION 76%
75.0000 mL | Freq: Once | INTRAVENOUS | Status: AC | PRN
  Administered 2024-09-21: 75 mL via INTRAVENOUS

## 2024-09-24 ENCOUNTER — Ambulatory Visit: Payer: Self-pay | Admitting: Cardiology

## 2024-09-24 NOTE — Progress Notes (Signed)
 CT angiogram of the chest reveals very mild dilatation of the ascending aorta at 4 cm, going forward you do not need any further follow-up CT scans.  This would be considered normal for your height, weight and age.  Coronary calcification is expected in a patient with known coronary artery disease.

## 2024-09-28 DIAGNOSIS — H2513 Age-related nuclear cataract, bilateral: Secondary | ICD-10-CM | POA: Diagnosis not present

## 2024-09-28 DIAGNOSIS — H30033 Focal chorioretinal inflammation, peripheral, bilateral: Secondary | ICD-10-CM | POA: Diagnosis not present

## 2024-09-28 DIAGNOSIS — H3581 Retinal edema: Secondary | ICD-10-CM | POA: Diagnosis not present

## 2024-09-29 DIAGNOSIS — H30033 Focal chorioretinal inflammation, peripheral, bilateral: Secondary | ICD-10-CM | POA: Diagnosis not present

## 2024-11-03 DIAGNOSIS — L570 Actinic keratosis: Secondary | ICD-10-CM | POA: Diagnosis not present

## 2024-11-03 DIAGNOSIS — L821 Other seborrheic keratosis: Secondary | ICD-10-CM | POA: Diagnosis not present

## 2024-11-03 DIAGNOSIS — C44329 Squamous cell carcinoma of skin of other parts of face: Secondary | ICD-10-CM | POA: Diagnosis not present

## 2024-11-15 ENCOUNTER — Encounter: Payer: Self-pay | Admitting: Cardiology

## 2024-11-15 ENCOUNTER — Ambulatory Visit: Attending: Cardiology | Admitting: Cardiology

## 2024-11-15 VITALS — BP 121/77 | HR 85 | Resp 16 | Ht 68.0 in | Wt 173.8 lb

## 2024-11-15 DIAGNOSIS — I10 Essential (primary) hypertension: Secondary | ICD-10-CM

## 2024-11-15 DIAGNOSIS — E7800 Pure hypercholesterolemia, unspecified: Secondary | ICD-10-CM

## 2024-11-15 DIAGNOSIS — I2510 Atherosclerotic heart disease of native coronary artery without angina pectoris: Secondary | ICD-10-CM

## 2024-11-15 NOTE — Progress Notes (Signed)
 Cardiology Office Note:  .   Date:  11/15/2024  ID:  Dan [name], DOB 12-05-1956, MRN [id number] PCP: Okey Carlin Redbird, MD  [facility] Providers Cardiologist:  Gordy Bergamo, MD   History of Present Illness: .   Dan [name] is a 68 y.o. 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  Patient presents for annual follow-up, he is presently doing well and remains asymptomatic. He maintains a very healthy diet and exercises on a regular basis. Has not used sublingual nitroglycerin . Denies chest pain, shortness of breath, PND, orthopnea, syncope, dizziness, swelling.     Discussed the use of AI scribe software for clinical note transcription with the patient, who gave verbal consent to proceed.  History of Present Illness Dan [name] is a 68 year old male with aortic aneurysm and prior myocardial infarction who presents for follow-up of his cardiovascular health.  His ascending aorta remains mildly dilated at 4 cm on CT angiogram from October 02, 2024, unchanged from December 2022. He has no new chest pain, dyspnea, or other concerning symptoms and exercises regularly.  He has prior myocardial infarction with EKG showing old infarct and no new changes. His last heart catheterization in 2017 showed stable disease, and he has had no interval ischemic symptoms.  Recent labs show eGFR 74 mL/min and LDL 63 mg/dL, indicating preserved renal function and well-controlled lipids.  Cardiac Studies relevent.    CT angiogram chest 10/02/2024, comparison 11/16/2021, 12/14/2021 1. Similar mild aortic root dilation level of sinuses of Valsalva measuring up to 4.0 cm in maximum short axis dimensions, unchanged by my measurements from comparison. 2. Severe 3 vessel coronary atherosclerotic calcifications 3. Aortic valvular calcifications.  Echocardiogram  09/23/2023: 1. Left ventricular ejection fraction, by estimation, is 55 to 60%. The left ventricle has normal function. The left ventricle has no regional wall motion abnormalities. There is mild concentric left ventricular hypertrophy. Left ventricular diastolic parameters are consistent with Grade I diastolic dysfunction (impaired relaxation). 2. Right ventricular systolic function is normal. The right ventricular size is normal. 3. The mitral valve is normal in structure. Trivial mitral valve regurgitation. No evidence of mitral stenosis. 4. The aortic valve has an indeterminant number of cusps. There is moderate calcification of the aortic valve. Aortic valve regurgitation is mild. Mild aortic valve stenosis. Aortic regurgitation PHT measures 809 msec. Aortic valve mean gradient  measures 5.7 mmHg. Aortic valve Vmax measures 1.64 m/s. 5. Aortic dilatation noted. There is mild dilatation of the aortic root, measuring 42 mm.   Coronary angiogram 05/20/2016: 1. Acute ST segment elevation myocardial infarction involving inferior and lateral wall. 2. Large very dominant right coronary artery with large PL and PDA branches supplying large area of inferior and inferolateral wall. One or percent occluded in the mid to distal segment. Successful PTCA and stenting of the distal and mid RCA with implantation of 2 overlapping 3.0 x 20 and 3.0 x 28 mm Synergy DES. Stenting of the proximal to mid segment of the right coronary artery with a 3.5 x 12 mm Synergy DES 100% occlusion in the distal end and 80% occlusion in the proximal end. Stenosis reduced to 0%. TIMI 0 flow improved to TIMI-3 flow.  3. Mild disease in the left coronary system. Tortuosity was evident. Mild coronary calcification evident.     Labs    Care everywhere/Faxed External Labs:  Labs 12/03/2023:  Total cholesterol 130,  triglycerides 54, HDL 55, LDL 63.  BUN 20, creatinine 1.1, eGFR 74 mL.  Potassium 4.2.  LFTs normal.  ROS  Review of  Systems  Cardiovascular:  Negative for chest pain, dyspnea on exertion and leg swelling.   Physical Exam:   VS:  BP 121/77 (BP Location: Left Arm, Patient Position: Sitting, Cuff Size: Normal)   Pulse 85   Resp 16   Ht 5' 8 (1.727 m)   Wt 173 lb 12.8 oz (78.8 kg)   SpO2 93%   BMI 26.43 kg/m    Wt Readings from Last 3 Encounters:  11/15/24 173 lb 12.8 oz (78.8 kg)  10/30/23 175 lb 12.8 oz (79.7 kg)  09/24/23 177 lb (80.3 kg)    BP Readings from Last 3 Encounters:  11/15/24 121/77  10/30/23 115/76  09/24/23 120/76   Physical Exam Neck:     Vascular: No carotid bruit or JVD.  Cardiovascular:     Rate and Rhythm: Normal rate and regular rhythm.     Pulses: Intact distal pulses.     Heart sounds: Normal heart sounds. No murmur heard.    No gallop.  Pulmonary:     Effort: Pulmonary effort is normal.     Breath sounds: Normal breath sounds.  Abdominal:     General: Bowel sounds are normal.     Palpations: Abdomen is soft.  Musculoskeletal:     Right lower leg: No edema.     Left lower leg: No edema.    EKG:    EKG Interpretation Date/Time:  Monday November 15 2024 16:36:29 EST Ventricular Rate:  81 PR Interval:  150 QRS Duration:  90 QT Interval:  354 QTC Calculation: 411 R Axis:   -53  Text Interpretation: Normal sinus rhythm Left axis deviation Possible Lateral infarct (cited on or before 15-Nov-2024) Inferior infarct (cited on or before 15-Nov-2024) When compared with ECG of 24-Sep-2023 16:41, No significant change was found Confirmed by Maekayla Giorgio, Jagadeesh [phone]) on 11/15/2024 4:50:24 PM    ASSESSMENT AND PLAN: .      ICD-10-CM   1. Coronary artery disease involving native coronary artery of native heart without angina pectoris  I25.10 EKG 12-Lead    2. Hypercholesteremia  E78.00     3. Primary hypertension  I10      Assessment & Plan Coronary artery disease Well-managed with no new changes on EKG. Last heart catheterization was in 2017, and he remains  stable. - Released care to primary care physician, Dr. Okey, for ongoing management.  Thoracic aortic aneurysm Very mild ascending aortic dilatation but not aneurysm is stable at 4 cm, unchanged since 2022. The ascending aorta is mildly dilated but not considered aneurysmal. No further imaging is required at this time. - No further imaging required.  Hypertension Blood pressure is well-controlled with current management that includes telmisartan 20 mg daily and carvedilol  6.25 mg twice daily.  Hypercholesterolemia LDL cholesterol is well-controlled at 63 mg/dL, below the target of 70 mg/dL.  Follow up: PRN.  Will request PCP to take over all prescription refills and I will see him back as needed. Signed,  Gordy Bergamo, MD, [facility] 11/15/2024, 4:57 PM [facility] [address] [place], [place] [address] Phone: [phone]. Fax:  [phone]

## 2024-11-15 NOTE — Patient Instructions (Signed)
 Medication Instructions:  Your physician recommends that you continue on your current medications as directed. Please refer to the Current Medication list given to you today.  *If you need a refill on your cardiac medications before your next appointment, please call your pharmacy*  Lab Work: NONE If you have labs (blood work) drawn today and your tests are completely normal, you will receive your results only by: MyChart Message (if you have MyChart) OR A paper copy in the mail If you have any lab test that is abnormal or we need to change your treatment, we will call you to review the results.  Testing/Procedures: NONE  Follow-Up: At [facility], you and your health needs are our priority.  As part of our continuing mission to provide you with exceptional heart care, our providers are all part of one team.  This team includes your primary Cardiologist (physician) and Advanced Practice Providers or APPs (Physician Assistants and Nurse Practitioners) who all work together to provide you with the care you need, when you need it.  Your next appointment:   As needed  Provider:   [place], MD  We recommend signing up for the patient portal called MyChart.  Sign up information is provided on this After Visit Summary.  MyChart is used to connect with patients for Virtual Visits (Telemedicine).  Patients are able to view lab/test results, encounter notes, upcoming appointments, etc.  Non-urgent messages can be sent to your provider as well.   To learn more about what you can do with MyChart, go to [email or web address].

## 2024-11-23 DIAGNOSIS — H2513 Age-related nuclear cataract, bilateral: Secondary | ICD-10-CM | POA: Diagnosis not present

## 2024-11-23 DIAGNOSIS — H3581 Retinal edema: Secondary | ICD-10-CM | POA: Diagnosis not present

## 2024-11-23 DIAGNOSIS — H30033 Focal chorioretinal inflammation, peripheral, bilateral: Secondary | ICD-10-CM | POA: Diagnosis not present
